# Patient Record
Sex: Female | Born: 1975 | Race: White | Hispanic: Yes | Marital: Married | Smoking: Never smoker
Health system: Southern US, Community
[De-identification: ages and names within clinical notes are randomized; demographics above are authoritative.]

## PROBLEM LIST (undated history)

## (undated) ENCOUNTER — Inpatient Hospital Stay (HOSPITAL_COMMUNITY): Payer: Self-pay

## (undated) DIAGNOSIS — Z789 Other specified health status: Secondary | ICD-10-CM

## (undated) HISTORY — PX: DILATION AND CURETTAGE OF UTERUS: SHX78

## (undated) HISTORY — PX: CHOLECYSTECTOMY: SHX55

---

## 2000-02-03 ENCOUNTER — Ambulatory Visit (HOSPITAL_COMMUNITY): Admission: RE | Admit: 2000-02-03 | Discharge: 2000-02-03 | Payer: Self-pay | Admitting: *Deleted

## 2000-06-12 ENCOUNTER — Inpatient Hospital Stay (HOSPITAL_COMMUNITY): Admission: AD | Admit: 2000-06-12 | Discharge: 2000-06-15 | Payer: Self-pay | Admitting: Obstetrics & Gynecology

## 2001-08-11 ENCOUNTER — Emergency Department (HOSPITAL_COMMUNITY): Admission: EM | Admit: 2001-08-11 | Discharge: 2001-08-11 | Payer: Self-pay | Admitting: Emergency Medicine

## 2004-05-05 ENCOUNTER — Emergency Department (HOSPITAL_COMMUNITY): Admission: EM | Admit: 2004-05-05 | Discharge: 2004-05-05 | Payer: Self-pay | Admitting: Emergency Medicine

## 2004-07-14 ENCOUNTER — Emergency Department (HOSPITAL_COMMUNITY): Admission: EM | Admit: 2004-07-14 | Discharge: 2004-07-14 | Payer: Self-pay | Admitting: Emergency Medicine

## 2004-10-06 ENCOUNTER — Ambulatory Visit (HOSPITAL_COMMUNITY): Admission: RE | Admit: 2004-10-06 | Discharge: 2004-10-06 | Payer: Self-pay | Admitting: *Deleted

## 2005-03-01 ENCOUNTER — Inpatient Hospital Stay (HOSPITAL_COMMUNITY): Admission: AD | Admit: 2005-03-01 | Discharge: 2005-03-05 | Payer: Self-pay | Admitting: Obstetrics & Gynecology

## 2005-03-01 ENCOUNTER — Ambulatory Visit: Payer: Self-pay | Admitting: *Deleted

## 2005-03-10 ENCOUNTER — Inpatient Hospital Stay (HOSPITAL_COMMUNITY): Admission: AD | Admit: 2005-03-10 | Discharge: 2005-03-10 | Payer: Self-pay | Admitting: Obstetrics and Gynecology

## 2005-03-16 ENCOUNTER — Inpatient Hospital Stay (HOSPITAL_COMMUNITY): Admission: AD | Admit: 2005-03-16 | Discharge: 2005-03-16 | Payer: Self-pay | Admitting: *Deleted

## 2005-03-16 ENCOUNTER — Ambulatory Visit: Payer: Self-pay | Admitting: *Deleted

## 2008-09-13 ENCOUNTER — Inpatient Hospital Stay (HOSPITAL_COMMUNITY): Admission: AD | Admit: 2008-09-13 | Discharge: 2008-09-13 | Payer: Self-pay | Admitting: Obstetrics & Gynecology

## 2008-11-12 ENCOUNTER — Inpatient Hospital Stay (HOSPITAL_COMMUNITY): Admission: AD | Admit: 2008-11-12 | Discharge: 2008-11-12 | Payer: Self-pay | Admitting: Family Medicine

## 2008-11-30 ENCOUNTER — Inpatient Hospital Stay (HOSPITAL_COMMUNITY): Admission: AD | Admit: 2008-11-30 | Discharge: 2008-12-02 | Payer: Self-pay | Admitting: Obstetrics & Gynecology

## 2008-11-30 ENCOUNTER — Ambulatory Visit: Payer: Self-pay | Admitting: Obstetrics & Gynecology

## 2008-12-01 ENCOUNTER — Encounter: Payer: Self-pay | Admitting: Obstetrics & Gynecology

## 2008-12-18 ENCOUNTER — Ambulatory Visit: Payer: Self-pay | Admitting: Family Medicine

## 2009-02-26 ENCOUNTER — Emergency Department (HOSPITAL_COMMUNITY): Admission: EM | Admit: 2009-02-26 | Discharge: 2009-02-27 | Payer: Self-pay | Admitting: Emergency Medicine

## 2009-09-08 ENCOUNTER — Encounter: Admission: RE | Admit: 2009-09-08 | Discharge: 2009-09-08 | Payer: Self-pay | Admitting: Geriatric Medicine

## 2009-09-19 ENCOUNTER — Emergency Department (HOSPITAL_COMMUNITY): Admission: EM | Admit: 2009-09-19 | Discharge: 2009-09-20 | Payer: Self-pay | Admitting: Emergency Medicine

## 2009-09-21 ENCOUNTER — Inpatient Hospital Stay (HOSPITAL_COMMUNITY): Admission: EM | Admit: 2009-09-21 | Discharge: 2009-09-24 | Payer: Self-pay | Admitting: Emergency Medicine

## 2009-09-22 ENCOUNTER — Encounter (INDEPENDENT_AMBULATORY_CARE_PROVIDER_SITE_OTHER): Payer: Self-pay | Admitting: General Surgery

## 2009-09-23 ENCOUNTER — Ambulatory Visit: Payer: Self-pay | Admitting: Gastroenterology

## 2009-10-22 ENCOUNTER — Encounter: Payer: Self-pay | Admitting: Gastroenterology

## 2010-09-15 NOTE — Procedures (Signed)
Summary: ERCP  Patient: [name] Note: All result statuses are Final unless otherwise noted.  Tests: (1) ERCP (ERC)   ERC ERCP                  DONE     [facility]     [address] [place], [place]  [address]           ERCP PROCEDURE REPORT           PATIENT:  [name], [name]  MR#:  [id number]     BIRTHDATE:  10-12-1975  GENDER:  female     ENDOSCOPIST:  Rachael Fee, MD     PROCEDURE DATE:  09/23/2009     PROCEDURE:  ERCP with sphincterotomy, ERCP with balloon passage     INDICATIONS:  recent lap chole, abnormal IOC suggesting distal CBD     stone           Referred by:   Darnell Level, MD     MEDICATIONS:  Fentanyl 100 mcg IV, Versed 10 mg IV, IV cipro as     inpatient     TOPICAL ANESTHETIC:  Cetacaine Spray           DESCRIPTION OF PROCEDURE:   After the risks benefits and     alternatives of the procedure were thoroughly explained, informed     consent was obtained.  Scout film showed surgical clips and JP     drain in RUQ.  The  endoscope was introduced through the mouth and     advanced to the second portion of the duodenum without detailed     examination of the UGI tract. The major papilla appeared normal.     A 44 Autotome over a .035 hydrawire was used to cannulate the bile     duct and then dye was injected. Cholangiogram revealed slightly     dilated CBD (7mm), non-dilated intrahepatic ducts, no biliary     leak.  The distal CBD ended somewhat bluntly, but there were no     clear mobile filling defects with bile duct. An adequate biliary     sphincterotomy was performed over the wire and then CBD was swept     3 times with a biliary balloon.  No debris, sludge or stones was     delivered into the duodenum.  Completion, occlusion cholangiogram     was normal.  The pancreatic duct was never cannulated or injected     with dye.           <<PROCEDUREIMAGES>>           COMPLICATIONS:  None           ENDOSCOPIC IMPRESSION:     1) Distal  CBD was blunted; I suspect that this was from     periampullary edema or perhaps ampullary stenosis.  Sphincterotomy     performed and bile duct swept without delivery of stone, debris or     sludge           RECOMMENDATIONS:     1) Follow clinically, OK to discharge per surgical service.     Watch for clinical pancreatitis.           ______________________________     Rachael Fee, MD           n.     eSIGNED:   Rachael Fee at 09/23/2009 09:56 AM  Nessie, Nong, [phone]  Note: An exclamation mark (!) indicates a result that was not dispersed into the flowsheet. Document Creation Date: 09/23/2009 9:57 AM _______________________________________________________________________  (1) Order result status: Final Collection or observation date-time: 09/23/2009 09:44 Requested date-time:  Receipt date-time:  Reported date-time:  Referring Physician:   Ordering Physician: Rob Bunting [phone]) Specimen Source:  Source: Launa Grill Order Number: [id number] Lab site:

## 2010-09-15 NOTE — Letter (Signed)
Summary: [facility]   Imported By: Sherian Rein 11/11/2009 11:08:56  _____________________________________________________________________  External Attachment:    Type:   Image     Comment:   External Document

## 2010-11-04 LAB — CBC
HCT: 35.1 % — ABNORMAL LOW (ref 36.0–46.0)
HCT: 35.6 % — ABNORMAL LOW (ref 36.0–46.0)
HCT: 42 % (ref 36.0–46.0)
Hemoglobin: 11.8 g/dL — ABNORMAL LOW (ref 12.0–15.0)
Hemoglobin: 11.9 g/dL — ABNORMAL LOW (ref 12.0–15.0)
Hemoglobin: 14.2 g/dL (ref 12.0–15.0)
MCHC: 33.1 g/dL (ref 30.0–36.0)
MCHC: 33.4 g/dL (ref 30.0–36.0)
MCHC: 33.8 g/dL (ref 30.0–36.0)
MCV: 84.6 fL (ref 78.0–100.0)
MCV: 83.1 fL (ref 78.0–100.0)
MCV: 83.3 fL (ref 78.0–100.0)
Platelets: 270 10*3/uL (ref 150–400)
Platelets: 215 10*3/uL (ref 150–400)
Platelets: 266 10*3/uL (ref 150–400)
RBC: 4.22 MIL/uL (ref 3.87–5.11)
RBC: 5.04 MIL/uL (ref 3.87–5.11)
RDW: 14 % (ref 11.5–15.5)
RDW: 14.3 % (ref 11.5–15.5)
RDW: 14.3 % (ref 11.5–15.5)
WBC: 15.8 10*3/uL — ABNORMAL HIGH (ref 4.0–10.5)
WBC: 10.6 10*3/uL — ABNORMAL HIGH (ref 4.0–10.5)

## 2010-11-04 LAB — URINALYSIS, ROUTINE W REFLEX MICROSCOPIC
Glucose, UA: NEGATIVE mg/dL
Ketones, ur: NEGATIVE mg/dL
Protein, ur: NEGATIVE mg/dL
Specific Gravity, Urine: 1.019 (ref 1.005–1.030)
pH: 6.5 (ref 5.0–8.0)

## 2010-11-04 LAB — URINE MICROSCOPIC-ADD ON

## 2010-11-04 LAB — URINE CULTURE: Colony Count: 25000

## 2010-11-04 LAB — COMPREHENSIVE METABOLIC PANEL
ALT: 257 U/L — ABNORMAL HIGH (ref 0–35)
AST: 180 U/L — ABNORMAL HIGH (ref 0–37)
AST: 224 U/L — ABNORMAL HIGH (ref 0–37)
AST: 92 U/L — ABNORMAL HIGH (ref 0–37)
Albumin: 4 g/dL (ref 3.5–5.2)
Albumin: 2.9 g/dL — ABNORMAL LOW (ref 3.5–5.2)
Alkaline Phosphatase: 100 U/L (ref 39–117)
Alkaline Phosphatase: 120 U/L — ABNORMAL HIGH (ref 39–117)
Alkaline Phosphatase: 136 U/L — ABNORMAL HIGH (ref 39–117)
Alkaline Phosphatase: 138 U/L — ABNORMAL HIGH (ref 39–117)
Alkaline Phosphatase: 165 U/L — ABNORMAL HIGH (ref 39–117)
BUN: 4 mg/dL — ABNORMAL LOW (ref 6–23)
CO2: 27 mEq/L (ref 19–32)
CO2: 24 mEq/L (ref 19–32)
CO2: 25 mEq/L (ref 19–32)
Calcium: 8.3 mg/dL — ABNORMAL LOW (ref 8.4–10.5)
Calcium: 8.5 mg/dL (ref 8.4–10.5)
Chloride: 105 mEq/L (ref 96–112)
Creatinine, Ser: 0.49 mg/dL (ref 0.4–1.2)
Creatinine, Ser: 0.5 mg/dL (ref 0.4–1.2)
GFR calc Af Amer: >60 mL/min (ref >60)
GFR calc Af Amer: >60 mL/min (ref >60)
GFR calc Af Amer: >60 mL/min (ref >60)
GFR calc non Af Amer: >60 mL/min (ref >60)
GFR calc non Af Amer: >60 mL/min (ref >60)
Glucose, Bld: 107 mg/dL — ABNORMAL HIGH (ref 70–99)
Glucose, Bld: 114 mg/dL — ABNORMAL HIGH (ref 70–99)
Glucose, Bld: 148 mg/dL — ABNORMAL HIGH (ref 70–99)
Potassium: 3.4 mEq/L — ABNORMAL LOW (ref 3.5–5.1)
Potassium: 3.5 mEq/L (ref 3.5–5.1)
Potassium: 4.3 mEq/L (ref 3.5–5.1)
Sodium: 135 mEq/L (ref 135–145)
Sodium: 138 mEq/L (ref 135–145)
Total Bilirubin: 3.8 mg/dL — ABNORMAL HIGH (ref 0.3–1.2)
Total Protein: 6.2 g/dL (ref 6.0–8.3)
Total Protein: 6.2 g/dL (ref 6.0–8.3)
Total Protein: 7.7 g/dL (ref 6.0–8.3)

## 2010-11-04 LAB — DIFFERENTIAL
Basophils Absolute: 0 10*3/uL (ref 0.0–0.1)
Eosinophils Relative: 0 % (ref 0–5)
Monocytes Absolute: 1.2 10*3/uL — ABNORMAL HIGH (ref 0.1–1.0)
Monocytes Relative: 8 % (ref 3–12)
Monocytes Relative: 7 % (ref 3–12)
Neutro Abs: 12.7 10*3/uL — ABNORMAL HIGH (ref 1.7–7.7)
Neutrophils Relative %: 80 % — ABNORMAL HIGH (ref 43–77)
Neutrophils Relative %: 67 % (ref 43–77)

## 2010-11-04 LAB — PROTIME-INR
INR: 0.91 (ref 0.00–1.49)
Prothrombin Time: 12.2 seconds (ref 11.6–15.2)

## 2010-11-04 LAB — PREGNANCY, URINE: Preg Test, Ur: NEGATIVE

## 2010-11-23 LAB — URINE MICROSCOPIC-ADD ON

## 2010-11-23 LAB — DIFFERENTIAL
Basophils Absolute: 0 10*3/uL (ref 0.0–0.1)
Eosinophils Relative: 0 % (ref 0–5)
Lymphocytes Relative: 17 % (ref 12–46)
Lymphs Abs: 2.2 10*3/uL (ref 0.7–4.0)
Monocytes Absolute: 1.1 10*3/uL — ABNORMAL HIGH (ref 0.1–1.0)
Neutro Abs: 10 10*3/uL — ABNORMAL HIGH (ref 1.7–7.7)

## 2010-11-23 LAB — COMPREHENSIVE METABOLIC PANEL
AST: 42 U/L — ABNORMAL HIGH (ref 0–37)
Albumin: 3.3 g/dL — ABNORMAL LOW (ref 3.5–5.2)
BUN: 11 mg/dL (ref 6–23)
Calcium: 9.2 mg/dL (ref 8.4–10.5)
Creatinine, Ser: 0.64 mg/dL (ref 0.4–1.2)
GFR calc Af Amer: >60 mL/min (ref >60)
GFR calc non Af Amer: >60 mL/min (ref >60)
Total Bilirubin: 0.4 mg/dL (ref 0.3–1.2)

## 2010-11-23 LAB — LIPASE, BLOOD: Lipase: 17 U/L (ref 11–59)

## 2010-11-23 LAB — CBC
HCT: 36.5 % (ref 36.0–46.0)
MCHC: 32.9 g/dL (ref 30.0–36.0)
MCV: 79.8 fL (ref 78.0–100.0)
Platelets: 252 10*3/uL (ref 150–400)
RDW: 15.8 % — ABNORMAL HIGH (ref 11.5–15.5)

## 2010-11-23 LAB — WET PREP, GENITAL: Clue Cells Wet Prep HPF POC: NONE SEEN

## 2010-11-23 LAB — URINALYSIS, ROUTINE W REFLEX MICROSCOPIC
Leukocytes, UA: NEGATIVE
Protein, ur: NEGATIVE mg/dL
Specific Gravity, Urine: 1.039 — ABNORMAL HIGH (ref 1.005–1.030)
Urobilinogen, UA: 1 mg/dL (ref 0.0–1.0)

## 2010-11-23 LAB — GC/CHLAMYDIA PROBE AMP, GENITAL
Chlamydia, DNA Probe: NEGATIVE
GC Probe Amp, Genital: NEGATIVE

## 2010-11-25 LAB — URINE CULTURE
Colony Count: 5000
Colony Count: >=100000
Special Requests: NEGATIVE
Special Requests: NEGATIVE

## 2010-11-25 LAB — CBC
Hemoglobin: 10.5 g/dL — ABNORMAL LOW (ref 12.0–15.0)
Hemoglobin: 12.2 g/dL (ref 12.0–15.0)
Hemoglobin: 8.9 g/dL — ABNORMAL LOW (ref 12.0–15.0)
MCHC: 34.1 g/dL (ref 30.0–36.0)
MCHC: 34.9 g/dL (ref 30.0–36.0)
MCV: 90 fL (ref 78.0–100.0)
Platelets: 167 10*3/uL (ref 150–400)
RBC: 2.9 MIL/uL — ABNORMAL LOW (ref 3.87–5.11)
RDW: 13.8 % (ref 11.5–15.5)
RDW: 14.1 % (ref 11.5–15.5)
RDW: 14.2 % (ref 11.5–15.5)
WBC: 15 10*3/uL — ABNORMAL HIGH (ref 4.0–10.5)

## 2010-11-25 LAB — GC/CHLAMYDIA PROBE AMP, GENITAL
Chlamydia, DNA Probe: NEGATIVE
GC Probe Amp, Genital: NEGATIVE

## 2010-11-25 LAB — HEPATITIS B SURFACE ANTIGEN: Hepatitis B Surface Ag: NEGATIVE

## 2010-11-25 LAB — STREP B DNA PROBE: Strep Group B Ag: POSITIVE

## 2010-11-25 LAB — DIFFERENTIAL
Basophils Absolute: 0.1 10*3/uL (ref 0.0–0.1)
Lymphocytes Relative: 13 % (ref 12–46)
Lymphs Abs: 1.9 10*3/uL (ref 0.7–4.0)
Monocytes Absolute: 1.1 10*3/uL — ABNORMAL HIGH (ref 0.1–1.0)
Neutro Abs: 11.9 10*3/uL — ABNORMAL HIGH (ref 1.7–7.7)

## 2010-11-25 LAB — URINALYSIS, ROUTINE W REFLEX MICROSCOPIC
Ketones, ur: NEGATIVE mg/dL
Leukocytes, UA: NEGATIVE
Nitrite: NEGATIVE
Protein, ur: NEGATIVE mg/dL
Urobilinogen, UA: 0.2 mg/dL (ref 0.0–1.0)

## 2010-11-25 LAB — RPR: RPR Ser Ql: NONREACTIVE

## 2010-11-25 LAB — WET PREP, GENITAL: Yeast Wet Prep HPF POC: NONE SEEN

## 2010-11-25 LAB — URINE MICROSCOPIC-ADD ON

## 2010-11-25 LAB — RUBELLA SCREEN: Rubella: 183.5 IU/mL — ABNORMAL HIGH

## 2010-11-25 LAB — PROTIME-INR
INR: 1.1 (ref 0.00–1.49)
Prothrombin Time: 14.7 seconds (ref 11.6–15.2)

## 2010-11-26 LAB — CBC
MCHC: 34 g/dL (ref 30.0–36.0)
MCV: 88.1 fL (ref 78.0–100.0)
Platelets: 192 10*3/uL (ref 150–400)
RBC: 3.84 MIL/uL — ABNORMAL LOW (ref 3.87–5.11)
WBC: 9 10*3/uL (ref 4.0–10.5)

## 2010-11-26 LAB — URINALYSIS, ROUTINE W REFLEX MICROSCOPIC
Bilirubin Urine: NEGATIVE
Glucose, UA: NEGATIVE mg/dL
Specific Gravity, Urine: 1.01 (ref 1.005–1.030)

## 2010-11-26 LAB — URINE MICROSCOPIC-ADD ON

## 2010-11-26 LAB — GC/CHLAMYDIA PROBE AMP, GENITAL
Chlamydia, DNA Probe: NEGATIVE
GC Probe Amp, Genital: NEGATIVE

## 2010-11-30 LAB — WET PREP, GENITAL
WBC, Wet Prep HPF POC: NONE SEEN
Yeast Wet Prep HPF POC: NONE SEEN

## 2010-11-30 LAB — URINALYSIS, ROUTINE W REFLEX MICROSCOPIC
Leukocytes, UA: NEGATIVE
Nitrite: NEGATIVE
Specific Gravity, Urine: 1.02 (ref 1.005–1.030)
Urobilinogen, UA: 0.2 mg/dL (ref 0.0–1.0)
pH: 7 (ref 5.0–8.0)

## 2010-11-30 LAB — ABO/RH: ABO/RH(D): A POS

## 2010-11-30 LAB — CBC
HCT: 37.6 % (ref 36.0–46.0)
MCHC: 33.8 g/dL (ref 30.0–36.0)
MCV: 88.5 fL (ref 78.0–100.0)
Platelets: 234 10*3/uL (ref 150–400)
RDW: 12.8 % (ref 11.5–15.5)
WBC: 10.4 10*3/uL (ref 4.0–10.5)

## 2010-11-30 LAB — URINE MICROSCOPIC-ADD ON

## 2010-12-29 NOTE — Discharge Summary (Signed)
NAMESAMEERAH, NACHTIGAL    ACCOUNT NO.:  [id number]   MEDICAL RECORD NO.:  [id number]          PATIENT TYPE:  INP   LOCATION:  9320                          [facility]ILITY:  [facility]   PHYSICIAN:  Norton Blizzard, MD    DATE OF BIRTH:  [birth year]   DATE OF ADMISSION:  11/30/2008  DATE OF DISCHARGE:  12/02/2008                               DISCHARGE SUMMARY   DISCHARGE DIAGNOSES:  1. Incompetent cervix.  2. Premature preterm rupture of membranes.  3. Premature delivery.  4. Endomyometritis.  5. Retained placenta.   REASON FOR ADMISSION:  [name] is a 35 year old, gravida 3,  para 1-1-1-2, admitted at 54 and 1 weeks' gestational age with abdominal  pain and leakage of fluid.  On evaluation in the MAU, she was found to  be grossly ruptured with bulging membranes protruding through the  cervix.  She was admitted initially for expectant management and then  for delivery.   HOSPITAL COURSE:  The patient was admitted and shortly after admission  developed a fever.  Because of the infection, the patient was counseled  and she proceeded with an induction of labor.  After some Pitocin, the  patient had a vaginal delivery of a nonviable female on December 01, 2008,  at 4:30 in the morning.  She had been on Zosyn.  She received Cytotec  per vagina, and Pitocin was started; however, after 2 hours  she was  noted to have increased bleeding with clots and the patient unable to  tolerate vaginal exams.  Therefore, the patient was consented for  operative removal of the placenta.  The patient went to the operating  room and under anesthesia an exam was performed and the placenta was  manually removed by Dr. Elsie Lincoln.  Please refer to her dictation  for further details of this procedure.  The patient did very well after  the procedure.  She remained afebrile for over twenty four hours after  delivery.  Her abdominal pain is minimal.  Her lochia is also minimal.  She is ambulating and  has no significant complaints.  At time of  discharge, her white blood cell count was 10.3, hemoglobin/hematocrit  8.9/26.1 respectively, and platelets 153.   PROCEDURES:  1. Spontaneous vaginal delivery.  2. Examination under anesthesia with manual removal of placenta.   FINDINGS:  As per hospital course.   CONDITION AT DISPOSITION:  Good.   MEDICATIONS AT DISCHARGE:  Ibuprofen and Percocet.   INSTRUCTIONS FOR THE PATIENT:  The patient was instructed to follow up  in the clinic in 2-3 weeks, and she will be given an appointment for  this.  She was instructed that it is recommended that she avoid getting  pregnant for at least 3 months to allow for recuperation.  Additionally,  she was also instructed that should she become pregnant  she needs to come the clinic at the earliest when she knows she is  pregnant because most likely she will need a cerclage.  The patient  voiced understanding of these instructions.  We will discuss  contraception at her followup appointment in 2-3 weeks.      Odie Sera, DO  Electronically Signed     ______________________________  Norton Blizzard, MD    MC/MEDQ  D:  12/02/2008  T:  12/02/2008  Job:  [id number]

## 2010-12-29 NOTE — Group Therapy Note (Signed)
NAMEBRESHAY, [name] NO.:  [id number]   MEDICAL RECORD NO.:  [id number]          PATIENT TYPE:  WOC   LOCATION:  [facility]                   FACILITY:  [facility]   PHYSICIAN:  Argentina Donovan, MD        DATE OF BIRTH:  Dec 06, 1975   DATE OF SERVICE:                                  CLINIC NOTE   REASON FOR VISIT:  Follow up from fetal loss.   HISTORY OF PRESENT ILLNESS:  [name] is a 35 year old  gravida 3, para 1-1-1-2, who on November 30, 2008 was admitted to hospital  after she presented at approximately 26 and 1 weeks' gestation with  grossly ruptured membranes.  She went on to be induced and delivered a  nonviable infant on December 01, 2008.  Today, she relates that she has  minimal to no lower abdominal pain, and she has absolutely no bleeding.  She does, however, relate that she has intermittent midepigastric  burning that occurs from time to time.  She is also requesting  contraception pills.   PAST MEDICAL HISTORY:  None.   PAST SURGICAL HISTORY:  She has a history of cesarean section due to  reason unknown at this time.   MEDICATIONS:  None.   ALLERGIES:  She has no known allergies.   SOCIAL HISTORY:  She denies tobacco or alcohol use.   PHYSICAL EXAMINATION:  GENERAL:  She is healthy-appearing, in no acute  distress.  VITAL SIGNS:  Blood pressure 97/65, heart rate 67, respiratory rate 20,  temperature 97.0.  She is 5 feet 2 inches and weighs 185 pounds.  ABDOMEN:  Soft.  No tenderness is noted upon palpation.  She has no  organomegaly, and the uterus is not palpable.   ASSESSMENT/PLAN:  1. Status post delivery of a non viable infant secondary to cervical      incontinence and preterm premature rupture of membranes.  2. Contraceptive counseling.  The patient was started on Sprintec;      given a prescription for that.  3. Gastroesophageal reflux disease, and she was recommended to use      over-the-counter Zantac which she can get a [employer]  for four      dollars.   FOLLOWUP:  The patient will follow up on an as-needed basis.     ______________________________  Odie Sera, D.O.    ______________________________  Argentina Donovan, MD    MC/MEDQ  D:  12/18/2008  T:  12/18/2008  Job:  [id number]

## 2010-12-29 NOTE — Op Note (Signed)
[name], WHITEHAIR    ACCOUNT NO.:  [id number]   MEDICAL RECORD NO.:  [id number]          PATIENT TYPE:  INP   LOCATION:  9320                          [facility]ILITY[facility]   PHYSICIAN:  Lesly Dukes, M.D. DATE OF BIRTH:  1976-01-15   DATE OF PROCEDURE:  12/01/2008  DATE OF DISCHARGE:                               OPERATIVE REPORT   PREOPERATIVE DIAGNOSES:  A 36 year old female with retained placenta and  heavy vaginal bleeding status post miscarriage of a 19-week fetus.   POSTOPERATIVE DIAGNOSES:  A 35 year old female with retained placenta  and heavy vaginal bleeding status post miscarriage of a 19-week fetus.   PROCEDURE:  Exam under anesthesia and manual removal of placenta.   SURGEON:  Lesly Dukes, MD   ANESTHESIA:  General.   SPECIMENS:  Placenta to pathology.   ESTIMATED BLOOD LOSS:  500.   COMPLICATIONS:  None.   PROCEDURE:  After informed consent was obtained, the patient was taken  to the operating room where general anesthesia was induced.  The patient  was placed in dorsal lithotomy position and bladder was emptied.  Exam  under anesthesia was performed and placenta was removed manually.  The  urine cavity was explored to ensure that all products  of consumption  were removed.  The uterus was bleeding heavily at this point and the  patient received potassium bolus.  The bleeding was still continuing, so  we gave her 1 dose of Methergine which helped the uterus contract.  At  the end of the procedure, there was no bleeding visible.  The uterus was  nicely contracted.  The patient tolerated the procedure well.  Sponge,  lap, instrument, and needle counts were correct x2.  The patient to  recovery room in stable condition.      Lesly Dukes, M.D.  Electronically Signed     KHL/MEDQ  D:  12/01/2008  T:  12/02/2008  Job:  [id number]

## 2011-01-01 NOTE — Discharge Summary (Signed)
[name], [name]    ACCOUNT NO.:  [id number]   MEDICAL RECORD NO.:  [id number]          PATIENT TYPE:  INP   LOCATION:  9147                          [facility]   PHYSICIAN:  Barth Kirks, M.D.  DATE OF BIRTH:  06/29/1976   DATE OF ADMISSION:  03/01/2005  DATE OF DISCHARGE:  03/05/2005                                 DISCHARGE SUMMARY   ADMISSION DIAGNOSES:  1.  A 39-1/7 week intrauterine pregnancy.  2.  Spontaneous rupture of membranes.   DISCHARGE DIAGNOSES:  1.  Term low transverse cesarean section delivery of a female infant.  2.  Emergent low transverse cesarean section for arrest of descent, occiput      posterior position with fetal macrosomia.   PROCEDURE:  A low transverse cesarean section, emergent.   PERTINENT LABORATORY DATA:  On the day of discharge hemoglobin is 8.8,  hematocrit 27.1 post-delivery. CBC prior to delivery was: hemoglobin 11.4  and hematocrit of 35.6. The patient is rubella immune.  Blood type A positive, antibody negative. GBS negative.   HISTORY OF PRESENT ILLNESS:  The patient is a 35 year old G2, P0, 1, 0, 1,  who presented to MAU at 39-1/7 weeks with complaint of spontaneous rupture  of membranes at 10 a.m. on the day of admission. She had lower abdominal  pain. The patient stated she had contractions and rupture of membranes, no  vaginal bleeding. I did feel the fetus moving. It was noted that her vaginal  loss of fluid was clear. On cervical exam the patient was noted to be 4 to 5  and 90, -1, with a well applied vertex head. Baseline fetal heart rate was  140's to 150's with positive reactivity and positive variability. Her  contractions were every 7 minutes with duration of 30 seconds.  Bedside  ultrasound confirmed a vertex position. Vital signs were stable with a blood  pressure of 109/70 and a temperature of 98.6. It was decided to admit the  patient to L&D to begin induction of labor for spontaneous rupture of  membranes with Pitocin.   BRIEF HOSPITAL COURSE:  The patient was admitted to L&D and received an  epidural. She was then started on low dose Pitocin at 3 milliunits per  minute, having contractions every 2-4 minutes. The patient became fully  dilated and complete. She was then noted to not be pushing well after the  epidural was in place. However it was working very well. The patient was  given time to labor down. However, her pushing was not effective after the  hour of laboring down. The patient was noted to have an arrest of descent  and it was necessary for an urgent low transverse cesarean section. A low  transverse cesarean section was performed by Dr. Corky Sox on March 02, 2005. Estimated blood loss was 800 mL. Under continuous lumbar epidural  anesthesia, a viable female infant was delivered from the persistent occiput  anterior position without difficulty. The cord was doubly clamped and cut  and the baby was handed to the neonatologist in attendance. The placenta was  delivered spontaneously. There was some uterine atony and the patient  received  Methergine to which the uterus responded. All incisions were closed  in the usual fashion. Apgar scores were 9 and 9, weight was 9 pounds and 2  ounces. The patient was then transferred to the PACU and then to the  mother/baby floor on March 02, 2005. The patient has been doing well,  ambulating, has had a bowel movement and is having urination. She has  decreased bleeding. Her uterus is firm and below the umbilicus. Her incision  is well approximated without exudate. The patient is moderately obese and  will need to have her staples removed in the MAU 5 days after discharge. The  patient is without complaint and is now ready for discharge. She has  received maximum benefit of this hospitalization.   PHYSICAL EXAMINATION:  On the day of discharge vital signs: temperature  97.3, pulse 64, respirations 20, blood pressure 100/60. She  has a post-  delivery hemoglobin of 8.8. Her general exam reveals she is in no apparent  distress. Well-developed, Hispanic female. Cardiovascular - regular rate and  rhythm. Lungs clear to auscultation bilaterally. Extremities reveal no lower  extremity edema. The abdomen is soft, nontender with appropriately  tenderness around the incision. The uterus is firm, below the umbilicus. No  exudate noted, clear, dry and intact incision.   The patient is to follow up with [facility] in 6 weeks. The patient also  is to follow up at the MAU for staple removal 5 days post-discharge. The  patient will be bottle feeding and breast feeding. The patient desires IUD  for contraception. Will have this at the 6-week check up at [facility].  Note - the patient's baby is a female and she did not desire a circumcision at  this time.       MB/MEDQ  D:  03/05/2005  T:  03/05/2005  Job:  [id number]   cc:   [facility]

## 2011-01-01 NOTE — Op Note (Signed)
Michele, [name]    ACCOUNT NO.:  [id number]   MEDICAL RECORD NO.:  [id number]          PATIENT TYPE:  INP   LOCATION:  9147                          [facility]   PHYSICIAN:  Conni Elliot, M.D.DATE OF BIRTH:  Jul 13, 1976   DATE OF PROCEDURE:  03/02/2005  DATE OF DISCHARGE:                                 OPERATIVE REPORT   PREOPERATIVE DIAGNOSES:  1.  Arrest of descent.  2.  Persistent occiput posterior position.   POSTOPERATIVE DIAGNOSES:  1.  Arrest of descent.  2.  Persistent occiput posterior position.  3.  Fetal macrosomia.   OPERATION:  Low transverse cesarean section.   OPERATOR:  Conni Elliot, M.D.   ANESTHESIA:  Continuous lumbar epidural.   OPERATIVE PROCEDURE:  After placing the patient under continuous lumbar  epidural anesthetic, brought to an operative level, patient was supine in  left lateral tilt position and was prepped and draped in a sterile fashion.  A low transverse Pfannenstiel incision was made and incision made through  the fascia, the rectus muscles separated in the midline and the peritoneum  entered.  A bladder flap created.  A low transverse uterine incision was  made.  The baby was delivered from the persistent OP position without  difficulty, cord doubly clamped and cut and baby handed to neonatologist in  attendance.  The placenta was delivered spontaneously.  There was some  uterine atony, so the patient received Methergine.  Thus, the uterus  responded to that.  The uterus, bladder flap, peritoneum, fascia,  subcutaneous tissue and skin were closed in the usual fashion.  Estimated  blood loss 800 mL without replacement.  The fetus was a female weighing 9  pounds 2 ounces, Apgars of 9 and 9.       ASG/MEDQ  D:  03/02/2005  T:  03/02/2005  Job:  [id number]

## 2011-02-08 NOTE — Procedures (Signed)
Summary: ERCP   ERCP  Procedure date:  09/23/2009  Findings:      Location: [facility].  ERCP PROCEDURE REPORT  PATIENT:  [name], [name]  MR#:  [id number] BIRTHDATE:   03-07-1976   GENDER:   female ENDOSCOPIST:   Rachael Fee, MD PROCEDURE DATE:  09/23/2009 PROCEDURE:  ERCP with sphincterotomy, ERCP with balloon passage INDICATIONS: recent lap chole, abnormal IOC suggesting distal CBD stone  Referred by:    Darnell Level, MD MEDICATIONS:   Fentanyl 100 mcg IV, Versed 10 mg IV, IV cipro as inpatient TOPICAL ANESTHETIC:   Cetacaine Spray  DESCRIPTION OF PROCEDURE:   After the risks benefits and alternatives of the procedure were thoroughly explained, informed consent was obtained.  Scout film showed surgical clips and JP drain in RUQ.  The  endoscope was introduced through the mouth and advanced to the second portion of the duodenum without detailed examination of the UGI tract. The major papilla appeared normal.  A 44 Autotome over a .035 hydrawire was used to cannulate the bile duct and then dye was injected. Cholangiogram revealed slightly dilated CBD (7mm), non-dilated intrahepatic ducts, no biliary leak.  The distal CBD ended somewhat bluntly, but there were no clear mobile filling defects with bile duct. An adequate biliary sphincterotomy was performed over the wire and then CBD was swept 3 times with a biliary balloon.  No debris, sludge or stones was delivered into the duodenum.  Completion, occlusion cholangiogram was normal.  The pancreatic duct was never cannulated or injected with dye.   <<PROCEDUREIMAGES>>    <<OLD IMAGES>>  COMPLICATIONS:   None  ENDOSCOPIC IMPRESSION:  1) Distal CBD was blunted; I suspect that this was from periampullary edema or perhaps ampullary stenosis.  Sphincterotomy performed and bile duct swept without delivery of stone, debris or sludge  RECOMMENDATIONS:  1) Follow clinically, OK to discharge per surgical service.  Watch for  clinical pancreatitis.   _______________________________ Rachael Fee, MD

## 2011-08-17 NOTE — L&D Delivery Note (Signed)
NICU team notified when pt was 10 cm and they were present for delivery.  Pt received cytotec 800 mcg.  I was present for delivery and agree with note above. [facility]

## 2011-08-17 NOTE — L&D Delivery Note (Signed)
Delivery Note At 3:21 PM a viable 27 week preterm female was delivered via Vaginal, Spontaneous Delivery (Presentation: right occiput anterior).  APGAR: 8, 9; weight pending Placenta status: Intact, Spontaneous.  Cord: 3 vessels with the following complications: postpartum bleeding requiring Cytotec x1  Anesthesia: None  Episiotomy: None Lacerations: None Suture Repair: N/A Est. Blood Loss (mL): 400  Mom to postpartum.  Baby to nursery-stable.  Tana Conch 04/07/2012, 4:05 PM

## 2011-11-09 ENCOUNTER — Inpatient Hospital Stay (HOSPITAL_COMMUNITY): Payer: Self-pay

## 2011-11-09 ENCOUNTER — Encounter (HOSPITAL_COMMUNITY): Payer: Self-pay

## 2011-11-09 ENCOUNTER — Inpatient Hospital Stay (HOSPITAL_COMMUNITY)
Admission: AD | Admit: 2011-11-09 | Discharge: 2011-11-09 | Disposition: A | Discharge status: Home / Self Care | Payer: Self-pay | Source: Ambulatory Visit | Attending: Obstetrics & Gynecology | Admitting: Obstetrics & Gynecology

## 2011-11-09 DIAGNOSIS — O209 Hemorrhage in early pregnancy, unspecified: Secondary | ICD-10-CM | POA: Insufficient documentation

## 2011-11-09 HISTORY — DX: Other specified health status: Z78.9

## 2011-11-09 LAB — HCG, QUANTITATIVE, PREGNANCY: hCG, Beta Chain, Quant, S: 17229 m[IU]/mL — ABNORMAL HIGH (ref <5)

## 2011-11-09 LAB — URINALYSIS, ROUTINE W REFLEX MICROSCOPIC
Bilirubin Urine: NEGATIVE
Glucose, UA: 100 mg/dL — AB
Ketones, ur: NEGATIVE mg/dL
pH: 6 (ref 5.0–8.0)

## 2011-11-09 LAB — CBC
Platelets: 225 10*3/uL (ref 150–400)
RDW: 13.9 % (ref 11.5–15.5)
WBC: 9.4 10*3/uL (ref 4.0–10.5)

## 2011-11-09 LAB — WET PREP, GENITAL
Trich, Wet Prep: NONE SEEN
Yeast Wet Prep HPF POC: NONE SEEN

## 2011-11-09 LAB — URINE MICROSCOPIC-ADD ON

## 2011-11-09 NOTE — MAU Provider Note (Signed)
History     CSN: [id number]  Arrival date and time: 11/09/11 1158   First Provider Initiated Contact with Patient 11/09/11 1414      Chief Complaint  Patient presents with  . Vaginal Bleeding   HPI [name] is 36 y.o. [id number] [redacted]w[redacted]d weeks presenting with vaginal bleeding that began yesterday.  LMP 2/25.  Is concerned because she has a hx of pregnancy loss in second trimester.  She has an appointment to [facility].  She reports cramping more on the left.  Last bowel movement was yesterday and normal for her.  Eda Royal, interpreter in room.      Past Medical History  Diagnosis Date  . No pertinent past medical history     Past Surgical History  Procedure Date  . Dilation and curettage of uterus   . Cholecystectomy     No family history on file.  History  Substance Use Topics  . Smoking status: Not on file  . Smokeless tobacco: Not on file  . Alcohol Use: Not on file    Allergies: No Known Allergies  No prescriptions prior to admission    Review of Systems  Constitutional: Negative.   Gastrointestinal: Positive for abdominal pain (cramping).  Genitourinary:       + for vaginal bleeding   Physical Exam   Blood pressure 117/60, pulse 87, temperature 99.1 F (37.3 C), temperature source Oral, resp. rate 16, height 5' 2.75" (1.594 m), weight 91.683 kg (202 lb 2 oz), last menstrual period 10/01/2011.  Physical Exam  Constitutional: She is oriented to person, place, and time. She appears well-developed and well-nourished. No distress.  HENT:  Head: Normocephalic.  Neck: Normal range of motion.  Cardiovascular: Normal rate.   Respiratory: Effort normal.  GI: Soft. She exhibits no distension and no mass. There is no tenderness. There is no rebound and no guarding.  Genitourinary: Uterus is enlarged (slightly enlarged). Uterus is not tender. Cervix exhibits discharge. Right adnexum displays no mass, no tenderness and no fullness.  Left adnexum displays no mass, no tenderness and no fullness. No bleeding around the vagina. Vaginal discharge (pale brown tinted discharge without active bleeding) found.  Neurological: She is alert and oriented to person, place, and time.  Skin: Skin is warm and dry.   Results for orders placed during the hospital encounter of 11/09/11 (from the past 24 hour(s))  URINALYSIS, ROUTINE W REFLEX MICROSCOPIC     Status: Abnormal   Collection Time   11/09/11 12:25 PM      Component Value Range   Color, Urine YELLOW  YELLOW    APPearance HAZY (*) CLEAR    Specific Gravity, Urine >1.030 (*) 1.005 - 1.030    pH 6.0  5.0 - 8.0    Glucose, UA 100 (*) NEGATIVE (mg/dL)   Hgb urine dipstick LARGE (*) NEGATIVE    Bilirubin Urine NEGATIVE  NEGATIVE    Ketones, ur NEGATIVE  NEGATIVE (mg/dL)   Protein, ur NEGATIVE  NEGATIVE (mg/dL)   Urobilinogen, UA 0.2  0.0 - 1.0 (mg/dL)   Nitrite NEGATIVE  NEGATIVE    Leukocytes, UA NEGATIVE  NEGATIVE   URINE MICROSCOPIC-ADD ON     Status: Abnormal   Collection Time   11/09/11 12:25 PM      Component Value Range   Squamous Epithelial / LPF FEW (*) RARE    WBC, UA 0-2  <3 (WBC/hpf)   RBC / HPF 7-10  <3 (RBC/hpf)   Bacteria,  UA FEW (*) RARE   POCT PREGNANCY, URINE     Status: Abnormal   Collection Time   11/09/11 12:32 PM      Component Value Range   Preg Test, Ur POSITIVE (*) NEGATIVE   CBC     Status: Abnormal   Collection Time   11/09/11  1:04 PM      Component Value Range   WBC 9.4  4.0 - 10.5 (K/uL)   RBC 4.22  3.87 - 5.11 (MIL/uL)   Hemoglobin 11.9 (*) 12.0 - 15.0 (g/dL)   HCT 60.4 (*) 54.0 - 46.0 (%)   MCV 84.4  78.0 - 100.0 (fL)   MCH 28.2  26.0 - 34.0 (pg)   MCHC 33.4  30.0 - 36.0 (g/dL)   RDW 98.1  19.1 - 47.8 (%)   Platelets 225  150 - 400 (K/uL)  ABO/RH     Status: Normal   Collection Time   11/09/11  1:04 PM      Component Value Range   ABO/RH(D) A POS    HCG, QUANTITATIVE, PREGNANCY     Status: Abnormal   Collection Time   11/09/11   1:05 PM      Component Value Range   hCG, Beta Chain, Quant, S 17229 (*) <5 (mIU/mL)  WET PREP, GENITAL     Status: Abnormal   Collection Time   11/09/11  2:09 PM      Component Value Range   Yeast Wet Prep HPF POC NONE SEEN  NONE SEEN    Trich, Wet Prep NONE SEEN  NONE SEEN    Clue Cells Wet Prep HPF POC NONE SEEN  NONE SEEN    WBC, Wet Prep HPF POC FEW (*) NONE SEEN    Ultrasound Report:  Living IUP [redacted]w[redacted]d with small SCH, FHR 126, YS and FP seen.  EDD 07/07/12  MAU Course  Procedures  Gc/CHl culture to lab  MDM   Assessment and Plan  A:  Bleeding in early pregnancy      [redacted]w[redacted]d gestation with small subchorionic hemorrhage  P:  Pelvic rest until bleeding stops Return for increased bleeding or pain      Keep scheduled appointment in [facility] to begin prenatal care        Kwasi Joung,EVE M 11/09/2011, 2:16 PM

## 2011-11-09 NOTE — MAU Note (Signed)
States via [place], Spanish interpreter that LMP is 10/01/2011, coffee colored discharge noted since yesterday am, left sided lower abd pain, rates 4-5/10.

## 2011-11-09 NOTE — Discharge Instructions (Signed)
Hemorragia vaginal durante el embarazo, primer trimestre (Vaginal Bleeding During Pregnancy, First Trimester) Durante el embarazo es relativamente frecuente que se presente una pequea hemorragia (manchas). Esta situacin generalmente mejora por s misma. Existen muchas causas para la hemorragia o prdidas durante el principio del Psychiatrist. Algunas hemorragias pueden estar relacionadas al [employer] y otras no. Si aparecen retortijones con la hemorragia es ms serio y preocupante. Informe al mdico si tiene cualquier tipo de hemorragia vaginal.  CAUSAS  En la mayora de los [place] es normal.   El embarazo finaliza (aborto espontneo).   El Psychiatrist podra terminar (amenaza de aborto espontneo).   Infeccin o inflamacin del tero.   Tumores (plipos) en el tero.   El embarazo sucede por fuera del tero y en las trompas de falopio (embarazo ectpico).   Muchos quistes pequeos en el tero en lugar del tejido del embarazo (embarazo molar).  SNTOMAS Hemorragia o goteo vaginal con o sin retortijones. DIAGNSTICO Para evaluar el embarazo, el mdico podr:  Education officer, environmental un examen plvico.   Tomar anlisis de [place].   Realizar una prueba de [place].  Es importante seguir las indicaciones del profesional que la asiste.  TRATAMIENTO  Evaluacin del embarazo con anlisis de [place] y [place].   Reposo en cama (slo levantarse para ir al bao).   Inmunizacin con Rho-gam si la madre es Rh negativo y el padre es Rh positivo.  INSTRUCCIONES PARA EL CUIDADO DOMICILIARIO  Si el mdico le indica reposo en cama, usted necesitar hacer algunos arreglos para que otra persona se ocupe del cuidado de los nios y de otras responsabilidades adicionales. Sin embargo, podr autorizarla a Building surveyor.   Lleve un registro de la cantidad y la saturacin de las toallas higinicas que Landscape architect. Escriba esta informacin:   No use tampones. No utilice duchas vaginales.   No  tenga relaciones sexuales u orgasmos hasta que el mdico la autorice.   Guarde una muestra de los tejidos para que el profesional lo inspeccione.   Tome medicamentos para el dolor slo con el permiso del mdico.   No tome aspirina, ya que puede causar hemorragias.  SOLICITE ATENCIN MDICA INMEDIATAMENTE SI:  Siente calambres intensos en el estmago, en la espalda o en el vientre (abdomen).   Usted tiene una temperatura oral de ms de 102 F (38.9 C) y no puede controlarla con medicamentos.   Elimina cogulos o tejidos grandes.   La hemorragia aumenta o se siente mareada dbil o tiene episodios de desmayos.   Comienza a sentir escalofros.   Tiene una prdida de lquido por la vagina.   No puede mover el intestino. Podra tratarse de un embarazo ectpico.  Document Released: 05/12/2005 Document Revised: 07/22/2011 [facility] Patient Information 2012 [place], [place].

## 2011-12-08 ENCOUNTER — Ambulatory Visit (INDEPENDENT_AMBULATORY_CARE_PROVIDER_SITE_OTHER): Payer: Self-pay | Admitting: Obstetrics and Gynecology

## 2011-12-08 ENCOUNTER — Encounter: Payer: Self-pay | Admitting: Obstetrics and Gynecology

## 2011-12-08 VITALS — BP 107/73 | Temp 99.3°F | Wt 201.8 lb

## 2011-12-08 DIAGNOSIS — Z3490 Encounter for supervision of normal pregnancy, unspecified, unspecified trimester: Secondary | ICD-10-CM

## 2011-12-08 DIAGNOSIS — O09299 Supervision of pregnancy with other poor reproductive or obstetric history, unspecified trimester: Secondary | ICD-10-CM

## 2011-12-08 DIAGNOSIS — O34219 Maternal care for unspecified type scar from previous cesarean delivery: Secondary | ICD-10-CM

## 2011-12-08 DIAGNOSIS — Z98891 History of uterine scar from previous surgery: Secondary | ICD-10-CM | POA: Insufficient documentation

## 2011-12-08 DIAGNOSIS — O0991 Supervision of high risk pregnancy, unspecified, first trimester: Secondary | ICD-10-CM

## 2011-12-08 LAB — POCT URINALYSIS DIP (DEVICE)
Bilirubin Urine: NEGATIVE
Glucose, UA: NEGATIVE mg/dL
Ketones, ur: NEGATIVE mg/dL
Specific Gravity, Urine: 1.025 (ref 1.005–1.030)

## 2011-12-08 MED ORDER — PRENAT VIT-FEPOLY-METHYLFOL-FA 29-1.13-0.4 MG PO TABS: 1.0000 | ORAL_TABLET | ORAL | Status: DC

## 2011-12-08 NOTE — Progress Notes (Signed)
Pulse 81 Has cramping on her left side Last pap smear Aug 2012- normal per pt. Needs rx for PNV.

## 2011-12-08 NOTE — Patient Instructions (Signed)
Insuficiencia cervical (Cervical Insufficiency) Se denomina insuficiencia cervical cuando el cuello del tero no es lo suficientemente fuerte como para mantener al beb (feto) en el interior del tero. Se produce a comienzos del 2do. o 3er. trimestre del embarazo. El cuello del tero se agranda (se dilata) sin contracciones. Cuando esto ocurre, las [employer] rodean al feto generalmente protruyen dentro de la canal del parto (vagina). Las [employer] romperse, lo que podra hacer que se interrumpa el embarazo (aborto espontneo). CAUSAS Generalmente no se conoce su causa. Las causas posibles son:  Lesiones en el cuello del tero debido a un embarazo anterior.   Lesiones en el cuello del tero debido a una ciruga anterior.   Tener un defecto congnito en el tero.   Cono fro, rayos lser o procedimientodeescisincon electrocauterio en asaen el cuello del tero.   [place] Moos dilatacin del tero durante un aborto.   Geralyn Flash expuesta al dietilstilbestrol durante el embarazo.   Falta de tejido (elastina y colgeno) en el cuello del tero para mantener el beb en el interior del mismo.   Cuello del tero ms corto que lo normal.  SNTOMAS  Manchas de sangre o hemorragia por la vagina.   Sensacin de presin vaginal.   Hemorragia vaginal anormal.  DIAGNSTICO  En muchos casos, el diagnstico no se realiza [employer] se pierde.   Generalmente el diagnstico se realiza a travs del examen.   En algunos casos es de utilidad realizar una ecografa del cuello del tero. La ecografa mide y sigue el largo del cuello del tero en las mujeres que tiene ms riesgo de sufrir insuficiencia cervical.   El mdico podr hacer un seguimiento de la dilatacin del cuello. Generalmente el diagnstico no puede establecerse hasta que el problema ocurre. Cuando ste es el caso, hay muchas probabilidades de perder Firefighter. Esto significa que el beb ha nacido [employer] para sobrevivir fuera de su madre.  PREVENCIN  Las pacientes de alto riesgo deben someterse a exmenes vaginales frecuentes y a Scientist, water quality.   Una sutura, como un frunce, alrededor del cuello del tero (cerclaje) antes de quedar embarazada.  TRATAMIENTO Cuando se diagnostica precozmente una incompetencia cervical, el tratamiento es un cerclaje. El Scientist, forensic un sostn adicional al cuello. Permite que el beb llegue a trmino. Generalmente se realiza antes del primer trimestre (entre las semanas 12 y 14). Generalmente no se realiza despus del segundo trimestre (24 semanas) excepto que haya una emergencia. El Biomedical engineer los riesgos de este procedimiento. El cerclaje puede retirarse cuando comienza el [place] de parto o cuando el embarazo est a trmino, antes que el [place] de parto se inicie. Tambin la sutura puede dejarse en el lugar para futuros embarazos. Si se deja en el lugar, el beb es extrado en una operacin cesrea. INSTRUCCIONES PARA EL CUIDADO DOMICILIARIO  Cumpla con todos los controles prenatales tal como se le indic.   Tome los [employer] como le indic el mdico.   Evite las actividades fsicas, ejercicios y Associate Professor sexuales hasta que el mdico la autorice.   No se de duchas vaginales ni use tampones.   Reanude su dieta habitual.  SOLICITE ATENCIN MDICA SI: Brett Fairy hemorragia vaginal anormal. SOLICITE ATENCIN MDICA DE INMEDIATO SI:  Tiene fiebre.   Tiene contracciones uterinas.   No siente los movimientos del beb, o ste no se mueve tanto como lo haca habitualmente.   Se desmaya.   Presenta una hemorragia vaginal abundante.  Tiene una prdida importante de lquido por la vagina.   Aparece sangre en la orina o dolor al [employer].  Document Released: 08/02/2005 Document Revised: 07/22/2011 [facility] Patient Information 2012 [place], [place].

## 2011-12-08 NOTE — Progress Notes (Signed)
DT not heard (has had +FH on early [place]) Ur cult, HIV, ABS, 1 hr OGTT today Rx PNV  Subjective:    [name] is a [id number] [redacted]w[redacted]d being seen today for her first obstetrical visit.  Her obstetrical history is significant for 19 wk SAB with last pregnnacy. Patient does intend to breast feed. Pregnancy history fully reviewed.  Patient reports intermittetn left groin cramps.  Filed Vitals:   12/08/11 0936  BP: 107/73  Temp: 99.3 F (37.4 C)  Weight: 201 lb 12.8 oz (91.536 kg)    HISTORY: OB History    Grav Para Term Preterm Abortions TAB SAB Ect Mult Living   4 2 2  0 1 0 1 0 0 2     # Outc Date GA Lbr Len/2nd Wgt Sex Del Anes PTL Lv   1 TRM 10/01   7lb(3.175kg) F SVD EPI  Yes   Comments: Pt states that baby was born at 8 months.   2 TRM 7/06 [redacted]w[redacted]d  9lb6oz(4.252kg)  CS EPI  Yes   3 SAB            4 CUR              Past Medical History  Diagnosis Date  . No pertinent past medical history    Past Surgical History  Procedure Date  . Dilation and curettage of uterus   . Cholecystectomy   . Cesarean section    Family History  Problem Relation Age of Onset  . Anesthesia problems Neg Hx      Exam    Uterus:     Pelvic Exam:    Perineum: Normal Perineum   Vulva: normal, Bartholin's, Urethra, Skene's normal   Vagina:  normal mucosa, normal discharge       Cervix: post, long, closed   Adnexa: normal adnexa and no mass, fullness, tenderness   Bony Pelvis: average  System: Breast:  normal appearance, no masses or tenderness, Inspection negative   Skin: normal coloration and turgor, no rashes    Neurologic: oriented, normal   Extremities: normal strength, tone, and muscle mass, no deformities   HEENT thyroid without masses   Mouth/Teeth mucous membranes moist, pharynx normal without lesions and dental hygiene good   Neck supple   Cardiovascular: regular rate and rhythm   Respiratory:  appears well, vitals normal, no respiratory distress, acyanotic, normal RR, ear  and throat exam is normal, neck free of mass or lymphadenopathy, chest clear, no wheezing, crepitations, rhonchi, normal symmetric air entry   Abdomen: soft, non-tender; bowel sounds normal; no masses,  no organomegaly   Urinary: urethral meatus normal      Assessment:    Pregnancy: [id number] Patient Active Problem List  Diagnoses  . Supervision of high risk pregnancy in first trimester  19 wk SAB with P3 Prev C/S with P2      Plan:     Initial labs drawn. Prenatal vitamins. Problem list reviewed and updated. Genetic Screening discussed First Screen and Integrated Screen: declined.  Ultrasound discussed; fetal survey: requested.  Follow up in 2 weeks. 50% of37min visit spent on counseling and coordination of care.  F/U 2wk HRC consideration for  DT FH and consideration for cercalge    [name] 12/08/2011

## 2011-12-09 LAB — HIV ANTIBODY (ROUTINE TESTING W REFLEX): HIV: NONREACTIVE

## 2011-12-11 LAB — CULTURE, OB URINE: Colony Count: 75000

## 2011-12-23 ENCOUNTER — Other Ambulatory Visit: Payer: Self-pay | Admitting: Obstetrics & Gynecology

## 2011-12-23 ENCOUNTER — Ambulatory Visit (INDEPENDENT_AMBULATORY_CARE_PROVIDER_SITE_OTHER): Payer: Self-pay | Admitting: Obstetrics and Gynecology

## 2011-12-23 VITALS — BP 98/65 | Temp 97.0°F | Wt 202.4 lb

## 2011-12-23 DIAGNOSIS — O99810 Abnormal glucose complicating pregnancy: Secondary | ICD-10-CM | POA: Insufficient documentation

## 2011-12-23 DIAGNOSIS — Z23 Encounter for immunization: Secondary | ICD-10-CM

## 2011-12-23 DIAGNOSIS — Z98891 History of uterine scar from previous surgery: Secondary | ICD-10-CM

## 2011-12-23 DIAGNOSIS — O3430 Maternal care for cervical incompetence, unspecified trimester: Secondary | ICD-10-CM

## 2011-12-23 DIAGNOSIS — R7309 Other abnormal glucose: Secondary | ICD-10-CM

## 2011-12-23 DIAGNOSIS — O09299 Supervision of pregnancy with other poor reproductive or obstetric history, unspecified trimester: Secondary | ICD-10-CM

## 2011-12-23 DIAGNOSIS — Z9889 Other specified postprocedural states: Secondary | ICD-10-CM

## 2011-12-23 DIAGNOSIS — R319 Hematuria, unspecified: Secondary | ICD-10-CM

## 2011-12-23 DIAGNOSIS — O3431 Maternal care for cervical incompetence, first trimester: Secondary | ICD-10-CM

## 2011-12-23 DIAGNOSIS — O0991 Supervision of high risk pregnancy, unspecified, first trimester: Secondary | ICD-10-CM

## 2011-12-23 MED ORDER — INFLUENZA VIRUS VACC SPLIT PF IM SUSP: 0.5000 mL | INTRAMUSCULAR | Status: DC

## 2011-12-23 MED ORDER — INFLUENZA VIRUS VACC SPLIT PF IM SUSP
0.5000 mL | Freq: Once | INTRAMUSCULAR | Status: AC
  Administered 2011-12-23: 0.5 mL via INTRAMUSCULAR

## 2011-12-23 NOTE — Progress Notes (Signed)
Reviewed genetic screening options and declines. Does want to proceed with prophylactic cerclage. Needs 3 hr OGTT  > scheduled 5/15 and will bring 24 hr urine for pro and cr cl with that visit and get Chem 24, Hgb A1C, TSH drawn.  Physical and Pap done (had viability scan last visit)   Objective: General: obese, NAD Breasts: sym, no discrete mass or nipple discharge Neck: thyroid ULNS Cor: RRR, no m Lungs: CTAB Abd NT, fundus 1/2 to u Pelvic: NEFG, physiologic d/c, Pap sent Bimanual: cx thick, closed, S equals D Ext: no calf tenderness or edema  A/P: Abnl OGTT> Diet  Reviewed, pamphlet given,  and F/U tests as above Cx insufficiency > cerclage with Dr. Debroah Loop 01/05/12 AMA> declines genetic testing Prev C/S> information on TOL vs. RC/S given

## 2011-12-23 NOTE — Progress Notes (Signed)
Flu shot desired

## 2011-12-23 NOTE — Progress Notes (Signed)
Addended by: Sherre Lain A on: 12/23/2011 10:51 AM   Modules accepted: Orders

## 2011-12-23 NOTE — Progress Notes (Signed)
Pulse- 90  Pain-ligament

## 2011-12-23 NOTE — Progress Notes (Signed)
Addended by: Faythe Casa on: 12/23/2011 03:24 PM   Modules accepted: Orders

## 2011-12-23 NOTE — Patient Instructions (Signed)
Diabetes y Doroteo Glassman fsica (Diabetes and Exercise) La actividad fsica regular es muy importante y [place] a:   Chief Operating Officer el nivel de glucosa en sangre (azcar).   Disminuir la presin arterial.   Controlar el colesterol en sangre (colesterol y triglicridos).   Mejorar el estado de salud general.  BENEFICIOS DE LA ACTIVIDAD FSICA  Mejora el buen estado fsico.   Mejora la flexibilidad.   Aumenta la resistencia.   Aumenta la densidad sea.   Favorece el control del peso.   Aumenta la fuerza muscular.   Disminuye la Art gallery manager.   Mejora la utilizacin de la insulina por parte del organismo.   Aumenta la sensibilidad a la insulina.   Reduce las necesidades de insulina.   Har que se sienta mejor.   Reduce el estrs y las tensiones.  Las personas diabticas que incorporan la actividad fsica a su estilo de vida obtienen beneficios adicionales.  Prdida de peso.   Reduccin del apetito.   Mejora la utilizacin de la glucosa por parte del organismo.   Disminuye los factores de riesgo para las enfermedades cardacas.   Disminuye el colesterol y los triglicridos.   Eleva el nivel de colesterol bueno (lipoproteinas de alta densidad HDL).   Disminuye el nivel de Banker.   Disminuye la presin arterial.  DIABETES TIPO I Y ACTIVIDAD FSICA  La actividad fsica disminuir el nivel de glucosa en [place].   Si el nivel de glucosa en sangre es de ms de 240 mg/dl, controle las cetonas en la [place]. Si hay cetonas, no realice actividad fisica.   El sitio de inyeccin de la insulina puede requerir un ajuste cuando se realiza actividad fsica. Evite inyectarse insulina en las zonas del cuerpo que ejercitar. Por ejemplo, evite inyectarse insulina en:   Los brazos, si juega al tenis.   Las piernas, si corre. Para obtener ms informacion, consulte a su mdico.   Lleve un registro de:   La ingesta de alimentos.   El tipo y cantidad de [place].     Los momentos esperables de picos de accin de la insulina.   Los niveles de Event organiser.  Hgalo antes, durante y despus de Printmaker actividad fsica. Verifique los registros junto con su mdico. Esto ser de utilidad para la confeccin de pautas para ajustar la ingesta de alimentos o las cantidades de [place].  DIABETES TIPO 2 Y ACTIVIDAD FSICA  La actividad fsica regular ayuda a controlar el nivel de glucosa en [place].   La actividad fsica es importante porque:   Aumenta la sensibilidad del organismo a la insulina.   Mejora el control del nivel de Event organiser.   Reduce el riesgo de enfermedades cardiacas. Disminuye el colesterol srico y los triglicridos. Disminuye la presin arterial.   Las personas que reciben insulina o agentes hipoglucemiantes por va oral deben controlar la aparicin de signos de hipoglucemia (mareos, temblores, sudoracin, escalofros y confusin).   Durante la actividad fsica se pierde Data processing manager. Esta prdida de lquidos debe reponerse. De este modo se evita la prdida de lquidos corporales (deshidratacin) y el golpe de Airline pilot.  Comente con su mdico antes de comenzar un programa de actividad fsica para verificar que sea seguro para usted. Recuerde, cualquier actividad es mejor que ninguna.  Document Released: 08/22/2007 Document Revised: 07/22/2011 [facility] Patient Information 2012 [place], [place].Cerclaje cervical (Cerclage of the Cervix) El cerclaje cervical es un procedimiento quirrgico en caso de un cuello incompetente. Un cuello incompetente es aquel  que no puede mantener un embarazo dentro del tero y se abre antes del comienzo del [place]. En el cerclaje se sutura el cuello cerrado Academic librarian. COMENTE CON SU MDICO:  Si sufre alergias a medicamentos o alimentos.   Todos los medicamentos de [address], prescriptos, hierbas, gotas oculares o cremas que est [place].   Si toma drogas ilegales o consume  alcohol en exceso.   Resfrios o una infeccines.   Problemas anteriores debido a anestsicos o a Careers adviser.   Antecedentes de cogulos sanguneos o hemorragias anormales.   Cualquier problema mdico o de salud.  RIESGOS Y COMPLICACIONES  Infecciones   Hemorragias.   Ruptura de [place].   Trabajo de parto o parto prematuros   Problemas con la anestesia.   Infecciones en el saco amnitico (membranas).  ANTES DEL PROCEDIMIENTO  No tome aspirina   No debe comer ni beber nada durante las 8 horas previas al examen.   No fume.   Si va a ser [employer] mismo da del procedimiento, concurra al menos 60 minutos antes de la ciruga para Oceanographer los formularios necesarios y prepararse.   Habr una zona de espera disponible para sus familiares y amigos.  PROCEDIMIENTO  Le aplicarn una va intravenosa y medicamentos para que se relaje.   Luego la harn dormir aplicndole un anestsico general.   Le darn puntos dentro y alrededor del cuello para ajustarlo y [place].  DESPUES DEL PROCEDIMIENTO  La llevarn a una sala de recuperacin en la que usted y el beb sern monitoreados y luego.   Cuando puedan moverla con seguridad para usted y el beb, la llevarn a una habitacin en el hospital.   En general se indica una inyeccin de progesterona para evitar las contracciones uterinas.   Posiblemente deba pasar la noche en el hospital.   Es una buena idea pedirle a alguien que conduzca hasta su casa y Surveyor, mining con usted durante uno o [address].   Es posible que le indiquen medicamentos para cuando regrese a Pensions consultant.  INSTRUCCIONES PARA EL CUIDADO DOMICILIARIO  Tome slo medicamentos de venta libre o prescriptos, para Engineer, materials, las molestias o para Personal assistant fiebre, segn las indicaciones del mdico.   Evite la actividad fsica y los ejercicios hasta que su mdico la autorice.   Siga con su dieta habitual.   No  tome duchas vaginales.   No tenga relaciones sexuales hasta que el mdico la autorice.   Cumpla con los controles quirrgicos y prenatales con su mdico.  SOLICITE ANTENCIN MDICA SI:  Brett Fairy hemorragia vaginal anormal.   Aparece una erupcin cutnea.   Tiene problemas con los medicamentos.   Si se siente confundida o desfalleciente.  SOLICITE ATENCIN MDICA DE INMEDIATO SI:  Presenta una hemorragia vaginal abundante.   Tiene una prdida importante de lquido de la vagina.   Su temperatura se eleva por encima de 102 F (38.9 C).   Se desmaya.   Tiene contracciones uterinas.   Siente que el beb no se mueve como lo hace habitualmente o usted no siente sus movimientos.  Document Released: 10/29/2008 Document Revised: 07/22/2011 [facility] Patient Information 2012 [place], [place].Diabetes and Exercise Regular exercise is important and can help:   Control blood glucose (sugar).   Decrease blood pressure.    Control blood lipids (cholesterol, triglycerides).   Improve overall health.  BENEFITS FROM EXERCISE  Improved fitness.   Improved flexibility.  Improved endurance.   Increased bone density.   Weight control.   Increased muscle strength.   Decreased body fat.   Improvement of the body's use of insulin, a hormone.   Increased insulin sensitivity.   Reduction of insulin needs.   Reduced stress and tension.   Helps you feel better.  People with diabetes who add exercise to their lifestyle gain additional benefits, including:  Weight loss.   Reduced appetite.   Improvement of the body's use of blood glucose.   Decreased risk factors for heart disease:   Lowering of cholesterol and triglycerides.   Raising the level of good cholesterol (high-density lipoproteins, HDL).   Lowering blood sugar.   Decreased blood pressure.  TYPE 1 DIABETES AND EXERCISE  Exercise will usually lower your blood glucose.   If blood glucose is greater  than 240 mg/dl, check urine ketones. If ketones are present, do not exercise.   Location of the insulin injection sites may need to be adjusted with exercise. Avoid injecting insulin into areas of the body that will be exercised. For example, avoid injecting insulin into:   The arms when playing tennis.   The legs when jogging. For more information, discuss this with your caregiver.   Keep a record of:   Food intake.   Type and amount of exercise.   Expected peak times of insulin action.   Blood glucose levels.  Do this before, during, and after exercise. Review your records with your caregiver. This will help you to develop guidelines for adjusting food intake and insulin amounts.  TYPE 2 DIABETES AND EXERCISE  Regular physical activity can help control blood glucose.   Exercise is important because it may:   Increase the body's sensitivity to insulin.   Improve blood glucose control.   Exercise reduces the risk of heart disease. It decreases serum cholesterol and triglycerides. It also lowers blood pressure.   Those who take insulin or oral hypoglycemic agents should watch for signs of hypoglycemia. These signs include dizziness, shaking, sweating, chills, and confusion.   Body water is lost during exercise. It must be replaced. This will help to avoid loss of body fluids (dehydration) or heat stroke.  Be sure to talk to your caregiver before starting an exercise program to make sure it is safe for you. Remember, any activity is better than none.  Document Released: 10/23/2003 Document Revised: 07/22/2011 Document Reviewed: 02/06/2009 [facility] Patient Information 2012 [place], [place].

## 2011-12-28 ENCOUNTER — Other Ambulatory Visit: Payer: Self-pay

## 2011-12-28 DIAGNOSIS — O09299 Supervision of pregnancy with other poor reproductive or obstetric history, unspecified trimester: Secondary | ICD-10-CM

## 2011-12-28 DIAGNOSIS — O0991 Supervision of high risk pregnancy, unspecified, first trimester: Secondary | ICD-10-CM

## 2011-12-28 DIAGNOSIS — R7309 Other abnormal glucose: Secondary | ICD-10-CM

## 2011-12-28 DIAGNOSIS — R319 Hematuria, unspecified: Secondary | ICD-10-CM

## 2011-12-28 LAB — CBC
HCT: 37.8 % (ref 36.0–46.0)
Hemoglobin: 12.3 g/dL (ref 12.0–15.0)
MCH: 28.1 pg (ref 26.0–34.0)
MCHC: 32.5 g/dL (ref 30.0–36.0)
MCV: 86.3 fL (ref 78.0–100.0)
RBC: 4.38 MIL/uL (ref 3.87–5.11)

## 2011-12-28 NOTE — Progress Notes (Signed)
Patient is Spanish speaking and will need interpreter.  PAT appt. Scheduled on Wednesday 12/29/11 at 2:15pm.  Used Spanish [employer] (via phone) who is currently with the patient in the OB/GYN clinic now for her 3 hour glucose testing.  Will inform Gaylyn Rong that we will need Spanish Interpreter for this PAT appt on Wednesday.

## 2011-12-29 ENCOUNTER — Encounter (HOSPITAL_COMMUNITY): Payer: Self-pay

## 2011-12-29 ENCOUNTER — Encounter: Payer: Self-pay | Admitting: Family Medicine

## 2011-12-29 ENCOUNTER — Encounter (HOSPITAL_COMMUNITY)
Admission: RE | Admit: 2011-12-29 | Discharge: 2011-12-29 | Disposition: A | Discharge status: Home / Self Care | Payer: Self-pay | Source: Ambulatory Visit | Attending: Obstetrics & Gynecology | Admitting: Obstetrics & Gynecology

## 2011-12-29 LAB — COMPREHENSIVE METABOLIC PANEL
AST: 14 U/L (ref 0–37)
Alkaline Phosphatase: 63 U/L (ref 39–117)
BUN: 10 mg/dL (ref 6–23)
Creat: 0.41 mg/dL — ABNORMAL LOW (ref 0.50–1.10)
Glucose, Bld: 100 mg/dL — ABNORMAL HIGH (ref 70–99)
Potassium: 3.9 mEq/L (ref 3.5–5.3)
Total Bilirubin: 0.3 mg/dL (ref 0.3–1.2)

## 2011-12-29 LAB — CREATININE CLEARANCE, URINE, 24 HOUR
Creatinine Clearance: 236 mL/min — ABNORMAL HIGH (ref 75–115)
Creatinine: 0.43 mg/dL — ABNORMAL LOW (ref 0.50–1.10)

## 2011-12-29 LAB — PROTEIN, URINE, 24 HOUR
Protein, 24H Urine: 300 mg/d — ABNORMAL HIGH (ref 50–100)
Protein, Urine: 12 mg/dL

## 2011-12-29 LAB — GLUCOSE TOLERANCE, 3 HOURS
Glucose Tolerance, 1 hour: 178 mg/dL (ref 70–189)
Glucose Tolerance, 2 hour: 189 mg/dL — ABNORMAL HIGH (ref 70–164)
Glucose Tolerance, Fasting: 92 mg/dL (ref 70–104)

## 2011-12-29 NOTE — Patient Instructions (Addendum)
20 [name]  12/29/2011   Your procedure is scheduled on:  01/05/12  Enter through the Main Entrance of [facility] at 830 AM.  Pick up the phone at the desk and dial [phone].   Call this number if you have problems the morning of surgery: [phone]   Remember:   Do not eat food:After Midnight.  Do not drink clear liquids: After Midnight.  Take these medicines the morning of surgery with A SIP OF WATER: NA   Do not wear jewelry, make-up or nail polish.  Do not wear lotions, powders, or perfumes. You may wear deodorant.  Do not shave 48 hours prior to surgery.  Do not bring valuables to the hospital.  Contacts, dentures or bridgework may not be worn into surgery.  Leave suitcase in the car. After surgery it may be brought to your room.  For patients admitted to the hospital, checkout time is 11:00 AM the day of discharge.   Patients discharged the day of surgery will not be allowed to drive home.  Name and phone number of your driver: [phone]  Special Instructions: CHG Shower Use Special Wash: 1/2 bottle night before surgery and 1/2 bottle morning of surgery.   Please read over the following fact sheets that you were given: Surgical Site Infection Prevention

## 2011-12-30 ENCOUNTER — Other Ambulatory Visit: Payer: Self-pay | Admitting: Obstetrics & Gynecology

## 2012-01-04 MED ORDER — CEFAZOLIN SODIUM-DEXTROSE 2-3 GM-% IV SOLR
2.0000 g | INTRAVENOUS | Status: AC
  Administered 2012-01-05: 2 g via INTRAVENOUS
  Filled 2012-01-04: qty 50

## 2012-01-05 ENCOUNTER — Encounter (HOSPITAL_COMMUNITY): Payer: Self-pay | Admitting: Obstetrics & Gynecology

## 2012-01-05 ENCOUNTER — Encounter (HOSPITAL_COMMUNITY): Payer: Self-pay | Admitting: *Deleted

## 2012-01-05 ENCOUNTER — Ambulatory Visit (HOSPITAL_COMMUNITY)
Admission: RE | Admit: 2012-01-05 | Discharge: 2012-01-05 | Disposition: A | Discharge status: Home / Self Care | Payer: Self-pay | Source: Ambulatory Visit | Attending: Obstetrics & Gynecology | Admitting: Obstetrics & Gynecology

## 2012-01-05 ENCOUNTER — Encounter (HOSPITAL_COMMUNITY)
Admission: RE | Disposition: A | Discharge status: Home / Self Care | Payer: Self-pay | Source: Ambulatory Visit | Attending: Obstetrics & Gynecology

## 2012-01-05 ENCOUNTER — Encounter (HOSPITAL_COMMUNITY): Payer: Self-pay | Admitting: Anesthesiology

## 2012-01-05 ENCOUNTER — Ambulatory Visit (HOSPITAL_COMMUNITY): Payer: Self-pay | Admitting: Anesthesiology

## 2012-01-05 DIAGNOSIS — O3430 Maternal care for cervical incompetence, unspecified trimester: Secondary | ICD-10-CM | POA: Insufficient documentation

## 2012-01-05 DIAGNOSIS — Z01812 Encounter for preprocedural laboratory examination: Secondary | ICD-10-CM | POA: Insufficient documentation

## 2012-01-05 DIAGNOSIS — Z01818 Encounter for other preprocedural examination: Secondary | ICD-10-CM | POA: Insufficient documentation

## 2012-01-05 HISTORY — PX: CERVICAL CERCLAGE: SHX1329

## 2012-01-05 SURGERY — CERCLAGE, CERVIX, VAGINAL APPROACH
Anesthesia: Spinal | Site: Cervix | Wound class: Clean Contaminated

## 2012-01-05 MED ORDER — HYDROCODONE-ACETAMINOPHEN 5-500 MG PO TABS: 1.0000 | ORAL_TABLET | Freq: Four times a day (QID) | ORAL | Status: AC | PRN

## 2012-01-05 MED ORDER — FENTANYL CITRATE 0.05 MG/ML IJ SOLN: 25.0000 ug | INTRAMUSCULAR | Status: DC | PRN

## 2012-01-05 MED ORDER — LACTATED RINGERS IV SOLN
INTRAVENOUS | Status: DC
  Administered 2012-01-05 (×3): via INTRAVENOUS

## 2012-01-05 MED ORDER — MEPERIDINE HCL 25 MG/ML IJ SOLN
6.2500 mg | INTRAMUSCULAR | Status: DC | PRN
Start: 2012-01-05 — End: 2012-01-05

## 2012-01-05 MED ORDER — LACTATED RINGERS IV SOLN: INTRAVENOUS | Status: DC

## 2012-01-05 MED ORDER — BUPIVACAINE IN DEXTROSE 0.75-8.25 % IT SOLN
INTRATHECAL | Status: DC | PRN
  Administered 2012-01-05: 1.2 mL via INTRATHECAL

## 2012-01-05 MED ORDER — ONDANSETRON HCL 4 MG/2ML IJ SOLN: 4.0000 mg | Freq: Once | INTRAMUSCULAR | Status: DC | PRN

## 2012-01-05 MED ORDER — HYDROCODONE-ACETAMINOPHEN 5-325 MG PO TABS
1.0000 | ORAL_TABLET | Freq: Once | ORAL | Status: AC
  Administered 2012-01-05: 1 via ORAL

## 2012-01-05 MED ORDER — KETOROLAC TROMETHAMINE 30 MG/ML IJ SOLN: 15.0000 mg | Freq: Once | INTRAMUSCULAR | Status: DC | PRN

## 2012-01-05 MED ORDER — HYDROCODONE-ACETAMINOPHEN 5-325 MG PO TABS
ORAL_TABLET | ORAL | Status: AC
  Filled 2012-01-05: qty 1

## 2012-01-05 SURGICAL SUPPLY — 15 items
CANISTER SUCTION 2500CC (MISCELLANEOUS) ×2 IMPLANT
CATH ROBINSON RED A/P 16FR (CATHETERS) IMPLANT
CLOTH BEACON ORANGE TIMEOUT ST (SAFETY) ×2 IMPLANT
COUNTER NEEDLE 1200 MAGNETIC (NEEDLE) ×2 IMPLANT
GLOVE BIO SURGEON STRL SZ 6.5 (GLOVE) ×2 IMPLANT
GLOVE BIOGEL PI IND STRL 7.0 (GLOVE) ×2 IMPLANT
GLOVE BIOGEL PI INDICATOR 7.0 (GLOVE) ×2
GLOVE ECLIPSE 6.0 STRL STRAW (GLOVE) ×1 IMPLANT
NEEDLE MAYO .5 CIRCLE (NEEDLE) ×2 IMPLANT
NS IRRIG 1000ML POUR BTL (IV SOLUTION) ×2 IMPLANT
PACK VAGINAL MINOR WOMEN LF (CUSTOM PROCEDURE TRAY) ×2 IMPLANT
PAD PREP 24X48 CUFFED NSTRL (MISCELLANEOUS) ×2 IMPLANT
SUT MERSILENE 5MM BP 1 12 (SUTURE) ×3 IMPLANT
TUBING NON-CON 1/4 X 20 CONN (TUBING) ×2 IMPLANT
YANKAUER SUCT BULB TIP NO VENT (SUCTIONS) ×2 IMPLANT

## 2012-01-05 NOTE — H&P (Signed)
Silvia Hightower is an 36 y.o. female. Presents for prophylactic cerclage at 13.[redacted] weeks gestation. 19 weeks loss 2010 cervical incompetence Pertinent Gynecological History: Menses: currently pregnant Bleeding: none Contraception: pregnant DES exposure: denies Blood transfusions: none Sexually transmitted diseases: no past history Previous GYN Procedures:   Last mammogram:  Date:  Last pap: normal Date:  OB History: G4, P2012   Menstrual History: Menarche age:  Patient's last menstrual period was 10/01/2011.    Past Medical History  Diagnosis Date  . No pertinent past medical history     Past Surgical History  Procedure Date  . Dilation and curettage of uterus   . Cholecystectomy   . Cesarean section     Family History  Problem Relation Age of Onset  . Anesthesia problems Neg Hx     Social History:  reports that she has never smoked. She has never used smokeless tobacco. She reports that she does not drink alcohol or use illicit drugs.  Allergies: No Known Allergies  Prescriptions prior to admission  Medication Sig Dispense Refill  . Prenat Vit-FePoly-Methylfol-FA 29-1.13-0.4 MG TABS Take 1 tablet by mouth 1 day or 1 dose.  30 tablet  5    Review of Systems  Constitutional: Negative.   Respiratory: Negative.   Cardiovascular: Negative.   Genitourinary: Negative.     Blood pressure 107/67, pulse 94, temperature 98.1 F (36.7 C), temperature source Oral, resp. rate 18, last menstrual period 10/01/2011, SpO2 98.00%. Physical Exam  Constitutional: She is oriented to person, place, and time. She appears well-developed. No distress.  HENT:  Head: Normocephalic.  Neck: Normal range of motion.  Cardiovascular: Normal rate.   Respiratory: Effort normal and breath sounds normal.  GI: Soft. She exhibits no mass. There is no tenderness.  Genitourinary:       Deferred  Musculoskeletal: She exhibits no edema.  Neurological: She is alert and oriented to  person, place, and time.  Skin: Skin is warm and dry.  Psychiatric: She has a normal mood and affect. Her behavior is normal.    No results found for this or any previous visit (from the past 24 hour(s)).  No results found.  Assessment/Plan: Cervical incompetence last pregnancy, scheduled for cerclage. Procedure and risk of failure, bleeding, infection, ROM, pain discussed and questions were answered, via interpreter. Signed consent.  Anabelle Bungert 01/05/2012, 9:16 AM

## 2012-01-05 NOTE — Anesthesia Postprocedure Evaluation (Signed)
Anesthesia Post Note  Patient: [name]  Procedure(s) Performed: Procedure(s) (LRB): CERCLAGE CERVICAL (N/A)  Anesthesia type: Spinal  Patient location: PACU  Post pain: Pain level controlled  Post assessment: Post-op Vital signs reviewed  Last Vitals:  Filed Vitals:   01/05/12 1145  BP: 102/50  Pulse: 71  Temp:   Resp: 34    Post vital signs: Reviewed  Level of consciousness: awake  Complications: No apparent anesthesia complications

## 2012-01-05 NOTE — Anesthesia Preprocedure Evaluation (Signed)
Anesthesia Evaluation  Patient identified by MRN, date of birth, ID band Patient awake    Reviewed: Allergy & Precautions, H&P , NPO status , Patient's Chart, lab work & pertinent test results  Airway Mallampati: II TM Distance: >3 FB Neck ROM: full    Dental No notable dental hx.    Pulmonary neg pulmonary ROS,    Pulmonary exam normal       Cardiovascular negative cardio ROS      Neuro/Psych negative neurological ROS  negative psych ROS   GI/Hepatic negative GI ROS, Neg liver ROS,   Endo/Other  Morbid obesity  Renal/GU negative Renal ROS  negative genitourinary   Musculoskeletal   Abdominal (+) + obese,   Peds negative pediatric ROS (+)  Hematology negative hematology ROS (+)   Anesthesia Other Findings   Reproductive/Obstetrics (+) Pregnancy                           Anesthesia Physical Anesthesia Plan  ASA: III  Anesthesia Plan: Spinal   Post-op Pain Management:    Induction:   Airway Management Planned:   Additional Equipment:   Intra-op Plan:   Post-operative Plan:   Informed Consent: I have reviewed the patients History and Physical, chart, labs and discussed the procedure including the risks, benefits and alternatives for the proposed anesthesia with the patient or authorized representative who has indicated his/her understanding and acceptance.     Plan Discussed with: CRNA and Surgeon  Anesthesia Plan Comments:         Anesthesia Quick Evaluation  

## 2012-01-05 NOTE — Anesthesia Procedure Notes (Signed)
Spinal  Patient location during procedure: OR Start time: 01/05/2012 10:10 AM End time: 01/05/2012 10:16 AM Staffing Anesthesiologist: Sandrea Hughs Performed by: anesthesiologist  Preanesthetic Checklist Completed: patient identified, site marked, surgical consent, pre-op evaluation, timeout performed, IV checked, risks and benefits discussed and monitors and equipment checked Spinal Block Patient position: sitting Prep: DuraPrep Patient monitoring: heart rate, cardiac monitor, continuous pulse ox and blood pressure Approach: midline Location: L3-4 Injection technique: single-shot Needle Needle type: Sprotte  Needle gauge: 24 G Needle length: 9 cm Needle insertion depth: 8 cm Assessment Sensory level: T10

## 2012-01-05 NOTE — Discharge Instructions (Signed)
Cerclaje cervical (Cerclage of the Cervix) El cerclaje cervical es un procedimiento quirrgico en caso de un cuello incompetente. Un cuello incompetente es aquel que no puede mantener un embarazo dentro del tero y se abre antes del comienzo del trabajo de parto. En el cerclaje se sutura el cuello cerrado durante el embarazo. COMENTE CON SU MDICO:  Si sufre alergias a medicamentos o alimentos.   Todos los medicamentos de venta libre, prescriptos, hierbas, gotas oculares o cremas que est usando.   Si toma drogas ilegales o consume alcohol en exceso.   Resfrios o una infeccines.   Problemas anteriores debido a anestsicos o a la novocana   Cirugas anteriores.   Antecedentes de cogulos sanguneos o hemorragias anormales.   Cualquier problema mdico o de salud.  RIESGOS Y COMPLICACIONES  Infecciones   Hemorragias.   Ruptura de membranas.   Trabajo de parto o parto prematuros   Problemas con la anestesia.   Infecciones en el saco amnitico (membranas).  ANTES DEL PROCEDIMIENTO  No tome aspirina   No debe comer ni beber nada durante las 8 horas previas al examen.   No fume.   Si va a ser admitida en el hospital el mismo da del procedimiento, concurra al menos 60 minutos antes de la ciruga para firmar los formularios necesarios y prepararse.   Habr una zona de espera disponible para sus familiares y amigos.  PROCEDIMIENTO  Le aplicarn una va intravenosa y medicamentos para que se relaje.   Luego la harn dormir aplicndole un anestsico general.   Le darn puntos dentro y alrededor del cuello para ajustarlo y mantenerlo cerrado.  DESPUES DEL PROCEDIMIENTO  La llevarn a una sala de recuperacin en la que usted y el beb sern monitoreados y luego.   Cuando puedan moverla con seguridad para usted y el beb, la llevarn a una habitacin en el hospital.   En general se indica una inyeccin de progesterona para evitar las contracciones uterinas.    Posiblemente deba pasar la noche en el hospital.   Es una buena idea pedirle a alguien que conduzca hasta su casa y permanezca con usted durante uno o dos das.   Es posible que le indiquen medicamentos para cuando regrese a su casa.  INSTRUCCIONES PARA EL CUIDADO DOMICILIARIO  Tome slo medicamentos de venta libre o prescriptos, para aliviar el dolor, las molestias o para bajar la fiebre, segn las indicaciones del mdico.   Evite la actividad fsica y los ejercicios hasta que su mdico la autorice.   Siga con su dieta habitual.   No tome duchas vaginales.   No tenga relaciones sexuales hasta que el mdico la autorice.   Cumpla con los controles quirrgicos y prenatales con su mdico.  SOLICITE ANTENCIN MDICA SI:  Observa una hemorragia vaginal anormal.   Aparece una erupcin cutnea.   Tiene problemas con los medicamentos.   Si se siente confundida o desfalleciente.  SOLICITE ATENCIN MDICA DE INMEDIATO SI:  Presenta una hemorragia vaginal abundante.   Tiene una prdida importante de lquido de la vagina.   Su temperatura se eleva por encima de 102 F (38.9 C).   Se desmaya.   Tiene contracciones uterinas.   Siente que el beb no se mueve como lo hace habitualmente o usted no siente sus movimientos.  Document Released: 10/29/2008 Document Revised: 07/22/2011 [employer] Patient Information 2012 [employer]. 

## 2012-01-05 NOTE — Transfer of Care (Signed)
Immediate Anesthesia Transfer of Care Note  Patient: [name]  Procedure(s) Performed: Procedure(s) (LRB): CERCLAGE CERVICAL (N/A)  Patient Location: PACU  Anesthesia Type: Spinal  Level of Consciousness: awake, alert , oriented and patient cooperative  Airway & Oxygen Therapy: Patient Spontanous Breathing  Post-op Assessment: Report given to PACU RN and Post -op Vital signs reviewed and stable  Post vital signs: Reviewed and stable  Complications: No apparent anesthesia complications

## 2012-01-05 NOTE — Progress Notes (Signed)
Spanish Interpreter Norvel Richards in [place] for pre-op

## 2012-01-05 NOTE — Op Note (Signed)
Procedure: Cervical cerclage Preoperative diagnosis: Intrauterine pregnancy 13 weeks 5 days gestation with history of incompetent cervix Postoperative diagnosis: Same Surgeon: Dr. Scheryl Darter Anesthesia: Spinal  Estimated blood loss: 25 mL Complications: None Drains: None Counts: Correct   Patient gave written consent for cervical cerclage at 13 weeks 5 days gestation. She had a 19 week pregnancy loss in 2010 with a diagnosis of cervical incompetence. Patient identification was confirmed and she was brought to the operating room. She has received IV Kefzol. Spinal anesthesia was induced. She was placed in dorsal lithotomy position. The perineum and vagina were sterilely prepped and draped. The bladder was drained with catheter. Exam revealed a long closed cervix is a 14 week size uterus. Weighted speculum was placed and Deaver retractors were used to expose cervix. Mersilene tape was used to with a free needle. Sutures were placed in the vaginal mucosa at 30 reflection at the cervix at 12:00 2:00 5:00 7:00 and 10:00. The suture was tied at 6:00. The cervix was checked and the internal os was closed to the passage of a fingertip. There was no excessive bleeding. No sign of ruptured membranes. Patient tolerated the procedure well without complications. She is brought in stable condition to the recovery room.  Dr. Scheryl Darter 01/05/2012 11:15 AM

## 2012-01-06 ENCOUNTER — Encounter (HOSPITAL_COMMUNITY): Payer: Self-pay | Admitting: Obstetrics & Gynecology

## 2012-01-20 ENCOUNTER — Encounter: Payer: Self-pay | Admitting: Obstetrics & Gynecology

## 2012-01-27 ENCOUNTER — Ambulatory Visit (INDEPENDENT_AMBULATORY_CARE_PROVIDER_SITE_OTHER): Payer: Self-pay | Admitting: Family Medicine

## 2012-01-27 VITALS — BP 91/63 | Temp 97.9°F | Wt 207.1 lb

## 2012-01-27 DIAGNOSIS — IMO0002 Reserved for concepts with insufficient information to code with codable children: Secondary | ICD-10-CM

## 2012-01-27 DIAGNOSIS — O1210 Gestational proteinuria, unspecified trimester: Secondary | ICD-10-CM

## 2012-01-27 DIAGNOSIS — O3440 Maternal care for other abnormalities of cervix, unspecified trimester: Secondary | ICD-10-CM

## 2012-01-27 DIAGNOSIS — O26839 Pregnancy related renal disease, unspecified trimester: Secondary | ICD-10-CM

## 2012-01-27 DIAGNOSIS — R809 Proteinuria, unspecified: Secondary | ICD-10-CM

## 2012-01-27 DIAGNOSIS — N289 Disorder of kidney and ureter, unspecified: Secondary | ICD-10-CM

## 2012-01-27 DIAGNOSIS — O3430 Maternal care for cervical incompetence, unspecified trimester: Secondary | ICD-10-CM

## 2012-01-27 DIAGNOSIS — O3431 Maternal care for cervical incompetence, first trimester: Secondary | ICD-10-CM

## 2012-01-27 DIAGNOSIS — O99810 Abnormal glucose complicating pregnancy: Secondary | ICD-10-CM

## 2012-01-27 LAB — POCT URINALYSIS DIP (DEVICE): Ketones, ur: NEGATIVE mg/dL

## 2012-01-27 LAB — HEMOGLOBIN A1C: Hgb A1c MFr Bld: 5.7 % — ABNORMAL HIGH (ref <5.7)

## 2012-01-27 NOTE — Progress Notes (Signed)
Pulse 93 Patient reports occasional abdominal pain and ligament pain. Also reports numbness in right hip since cerclage insertion  Used Delorise Royals for interpreter

## 2012-01-27 NOTE — Progress Notes (Signed)
1 abnl value on 3 hour but all values are right under abnl value--will check A1C Discussed diet and she will follow Having some low abd pain and cramping since cerclage. No bleeding, no LOF Cervix is ftp/1cm long/soft--cerclage in place/LUSD Increase rest

## 2012-01-27 NOTE — Patient Instructions (Addendum)
Diabetes mellitus gestacional (Gestational Diabetes Mellitus) La diabetes mellitus gestacional se produce slo durante el embarazo. Aparece cuando el organismo no puede controlar adecuadamente la glucosa (azcar) que aumenta en la sangre despus de comer. Durante el [place], se produce una resistencia a la insulina (sensibilidad reducida a la insulina) debido a la liberacin de hormonas por parte de la placenta. Generalmente, el pncreas de una mujer embarazada produce la cantidad suficiente de insulina para vencer esa resistencia. Sin embargo, en la diabetes gestacional, hay insulina pero no cumple su funcin adecuadamente. Si la resistencia es lo suficientemente grave como para que el pncreas no produzca la cantidad de insulina suficiente, la glucosa extra se acumula en la sangre.  Devota Pace RIESGO DE DESARROLLAR DIABETES GESTACIONAL?  Las mujeres con historia de diabetes en la familia.   Las mujeres de ms de [address]   Las que presentan sobrepeso.   Las [employer] pertenecen a ciertos grupos tnicos (latinas, afroamericanas, norteamericanas nativas, asiticas y las originarias de las islas del Pacfico.  QUE PUEDE OCURRIRLE AL BEB? Si el nivel de glucosa en sangre de la madre es demasiado elevado mientras este Maish V[place], el nivel extra de azcar pasar por el cordn umbilical hacia el beb. Algunos de los problemas del beb pueden ser:  Beb demasiado grande: si el nio recibe Chief Strategy Officer, puede aumentar mucho de [place]. Esto puede hacer que sea demasiado grande para nacer por parto normal (vaginal) por lo que ser necesario realizar una cesrea.   Bajo nivel de glucosa (hipoglucemia): el beb produce insulina extra en respuesta a la excesiva cantidad de azcar que obtiene de [employer]. Cuando el beb nace y ya no necesita insulina extra, su nivel de azcar en sangre puede disminuir.   Ictericia (coloracin amarillenta de la piel y los ojos): esto es bastante frecuente en los  bebs. La causa es la acumulacin de una sustancia qumica denominada bilirrubina. No siempre es un trastorno grave, pero se observa con frecuencia en los bebs cuyas madres sufren diabetes gestacional.  RIESGOS PARA LA MADRE Las mujeres que han sufrido diabetes gestacional pueden tener ms riesgos para algunos problemas como:  Preeclampsia o toxemia, incluyendo problemas con hipertensin arterial. La presin arterial y los niveles de protenas en la orina deben controlarse con frecuencia.   Infecciones   Parto por cesrea.   Aparicin de diabetes tipo 2 en una etapa posterior de la vida. Alrededor del 30% al 50% sufrir diabetes posteriormente, especialmente las que son obesas.  DIAGNSTICO Las hormonas que causan resistencia a la insulina tienen su mayor nivel alrededor de las 24 a 28 semanas del Psychiatrist. Si se experimentan sntomas, stos son similares a los sntomas que normalmente aparecen durante el embarazo.  La diabetes mellitus gestacional generalmente se diagnostica por medio de un mtodo en dos partes: 1. Despus de la 24 a 28 semanas de Psychiatrist, la mujer debe beber una solucin que contiene glucosa y Education officer, environmental un anlisis de [place]. Si el nivel de glucosa es elevado, la realizarn un segundo Soldier.  2. La prueba oral de tolerancia a la glucosa, que dura aproximadamente tres horas. Despus de realizar ayuno durante la noche, se controla nivel de glucosa en sangre. La mujer bebe una solucin que contiene glucosa y Chief Executive Officer realizan anlisis de glucosa en sangre cada hora.  Si la mujer tiene factores de riesgos para la diabetes mellitus gestacional, el mdico podr Programme researcher, broadcasting/film/video anlisis antes de las 24 semanas de [place]. TRATAMIENTO El tratamiento est dirigido a Insurance underwriter en  sangre de la madre en un nivel normal y puede incluir:  La planificacin de los alimentos.   Recibir insulina u otro medicamento para controlar el nivel de glucosa en sangre.   La prctica de ejercicios.    Llevar un registro diario de los alimentos que consume.   Control y registro de los niveles de glucosa en sangre.   Control de los niveles de cetona en la orina, aunque esto ya no se considera necesario en la mayora de los embarazos.  INSTRUCCIONES PARA EL CUIDADO DOMICILIARIO Mientras est embarazada:  Siga los consejos de su mdico relacionados con los controles prenatales, la planificacin de la comida, la actividad fsica, los medicamentos, vitaminas, los anlisis de sangre y otras pruebas y las actividades fsicas.   Lleve un registro de las comidas, las pruebas de glucosa en sangre y la cantidad de insulina que recibe (si corresponde). Muestre todo al profesional en cada consulta mdica prenatal.   Si sufre diabetes mellitus gestacional, podr tener problemas de hipoglucemia (nivel bajo de glucosa en sangre). Podr sospechar este problema si se siente repentinamente mareada, tiene temblores y/o se siente dbil. Si cree que esto le est ocurriendo, y tiene un medidor de glucosa, mida su nivel de glucosa en sangre. Siga los consejos de su mdico sobre el modo y el momento de tratar su nivel de glucosa en sangre. Generalmente se sigue la regla 15:15 Consuma 15 g de hidratos de carbono, espere 15 minutos y vuelva controlar el nivel de glucosa en sangre.. Ejemplos de 15 g de hidratos de carbono son:   1 taza de leche descremada.    taza de jugo.   3-4 tabletas de glucosa.   5-6 caramelos duros.   1 caja pequea de pasas de uva.    taza de gaseosa comn.   Mantenga una buena higiene para evitar infecciones.   No fume.  SOLICITE ATENCIN MDICA SI:  Observa prdida vaginal con o sin picazn.   Se siente ms dbil o cansada que lo habitual.   Transpira mucho.   Tiene un aumento de peso repentino, 2,5 kg o ms en una semana.   Pierde peso, 1.5 kg o ms en una semana.   Su nivel de glucosa en sangre es elevado, necesita instrucciones.  SOLICITE ATENCIN MDICA DE  INMEDIATO SI:  Sufre una cefalea intensa.   Se marea o pierde el conocimiento   Presenta nuseas o vmitos.   Se siente desorientada confundida.   Sufre convulsiones.   Tiene problemas de visin.   Siente dolor en el estmago.   Presenta una hemorragia vaginal abundante.   Tiene contracciones uterinas.   Tiene una prdida importante de lquido por la vagina  DESPUS QUE NACE EL BEB:  Concurra a todos los controles de seguimiento y realice los anlisis de sangre segn las indicaciones de su mdico.   Mantenga un estilo de vida saludable para evitar la diabetes en el futuro. Aqu se incluye:   Siga el plan de alimentacin saludable.   Controle su peso.   Practique actividad fsica y descanse lo necesario.   No fume.   Amamante a su beb mientras pueda. Esto disminuir la probabilidad de que usted y su beb sufran diabetes posteriormente.  Para ms informacin acerca de la diabetes, visite la pgina web de la American Diabetes Association: www.americandiabetesassociation.org. Para ms informacin acerca de la diabetes gestacional cite la pgina web del American Congress of Obstetricians and Gynecologists en: www.acog.org. Document Released: 05/12/2005 Document Revised: 07/22/2011 ExitCare Patient Information 2012   ExitCare, [employer]. Embarazo - Segundo trimestre (Pregnancy - Second Trimester) El segundo trimestre del Psychiatrist (del 3 al ) es un perodo de evolucin rpida para usted y el beb. Hacia el final del sexto mes, el beb mide aproximadamente 23 cm y pesa 680 g. Comenzar a Pharmacologist del beb [place] 18 y las 20 [address] de [place]. Podr sentir las pataditas ("quickening en ingls"). Hay un rpido [employer]. Puede segregar un lquido claro Charity fundraiser) de las [place]. Quizs sienta pequeas contracciones en el vientre (tero) Esto se conoce como falso trabajo de parto o contracciones de Braxton-H[place]. Es como una prctica del trabajo de parto que se  produce cuando el beb est listo para salir. Generalmente los problemas de vmitos matinales ya se han superado hacia el final del Medical sales representative. Algunas mujeres desarrollan pequeas manchas oscuras (que se denominan cloasma, mscara del embarazo) en la cara que normalmente se van luego del nacimiento del beb. La exposicin al sol empeora las manchas. Puede desarrollarse acn en algunas mujeres embarazadas, y puede desaparecer en aquellas que ya tienen acn. EXAMENES PRENATALES  Durante los Manpower [employer], deber seguir realizando pruebas de [place], segn avance el [place]. Estas pruebas se realizan para controlar su salud y la del beb. Tambin se realizan anlisis de sangre para [employer] niveles de [place]. La anemia (bajo nivel de hemoglobina) es frecuente durante el embarazo. Para prevenirla, se administran hierro y vitaminas. Tambin se le realizarn exmenes para saber si tiene diabetes entre las 24 y las 28 semanas del [place]. Podrn repetirle algunas de las H[employer] Enterprises hicieron previamente.   En cada visita le medirn el tamao del tero. Esto se realiza para asegurarse de que el beb est creciendo correctamente de acuerdo al estado del [place].   Tambin en cada visita prenatal controlarn su presin arterial. Esto se realiza para asegurarse de que no tenga toxemia.   Se controlar su orina para asegurarse de que no tenga infecciones, diabetes o protena en la orina.   Se controlar su peso regularmente para asegurarse que el aumento ocurre al ritmo indicado. Esto se hace para asegurarse que usted y el beb tienen una evolucin normal.   En algunas ocasiones se realiza una prueba de ultrasonido para confirmar el correcto desarrollo y evolucin del beb. Esta prueba se realiza con ondas sonoras inofensivas para el beb, de modo que el profesional pueda calcular ms precisamente la fecha del [place].  Algunas veces se realizan pruebas especializadas del lquido amnitico  que rodea al beb. Esta prueba se denomina amniocentesis. El lquido amnitico se obtiene introduciendo una aguja en el vientre (abdomen). Se realiza para Conservator, museum/gallery en los que existe alguna preocupacin acerca de algn problema gentico que pueda sufrir el beb. En ocasiones se lleva a cabo cerca del final del embarazo, si es necesario inducir al Apple Computer. En este caso se realiza para asegurarse que los pulmones del beb estn lo suficientemente maduros como para que pueda vivir fuera del tero. CAMBIOS QUE OCURREN EN EL SEGUNDO TRIMESTRE DEL EMBARAZO Su organismo atravesar numerosos cambios durante el [employer]. Estos pueden variar de Neomia Dear persona a otra. [place] con el profesional que la asiste acerca los cambios que usted note y que la preocupen.  Durante el segundo trimestre probablemente sienta un aumento del apetito. Es normal tener "antojos" de Development worker, community. Esto vara de Neomia Dear persona a otra y de un embarazo a Therapist, art.   El abdomen inferior comenzar a abultarse.   Podr  tener la necesidad de Geographical information systems officer con ms frecuencia debido a que el tero y el beb presionan sobre la vejiga. Tambin es frecuente contraer ms infecciones urinarias durante el embarazo (dolor al ConocoPhillips). Puede evitarlas bebiendo gran cantidad de lquidos y vaciando la vejiga antes y despus de Sales promotion account executive.   Podrn aparecer las primeras estras en las caderas, abdomen y [place]. Estos son cambios normales del cuerpo durante el [place]. No existen medicamentos ni ejercicios que puedan prevenir [employer].   Es posible que comience a desarrollar venas inflamadas y abultadas (varices) en las piernas. El uso de medias de descanso, Optometrist sus pies durante 15 minutos, 3 a 4 veces al da y Film/video editor la sal en su dieta ayuda a Journalist, newspaper.   Podr sentir Engineering geologist gstrica a medida que el tero crece y Doctor, general practice. Puede tomar anticidos, con la autorizacin de su mdico,  para Financial planner. Tambin es til ingerir pequeas comidas 4 a 5 veces al Futures trader.   La constipacin puede tratarse con un laxante o agregando fibra a su dieta. Beber grandes cantidades de lquidos, comer vegetales, frutas y granos integrales es de [place].   Tambin es beneficioso practicar actividad fsica. Si ha sido una persona Engineer, mining, podr continuar con la Harley[employer]son de las actividades durante el mismo. Si ha sido [employer], puede ser beneficioso que comience con un programa de ejercicios, Museum/gallery exhibitions officer.   Puede desarrollar hemorroides (vrices en el recto) hacia el final del segundo trimestre. Tomar baos de asiento tibios y Chemical engineer cremas recomendadas por el profesional que lo asiste sern de ayuda para los problemas de hemorroides.   Tambin podr Financial risk analyst de espalda durante este momento de su embarazo. Evite levantar objetos pesados, utilice zapatos de taco bajo y [place] buena postura para ayudar a reducir los problemas de [place].   Algunas mujeres embarazadas desarrollan hormigueo y adormecimiento de la mano y los dedos debido a la hinchazn y compresin de los ligamentos de la mueca (sndrome del tnel carpiano). Esto desaparece una vez que el beb nace.   Como sus pechos se agrandan, Pension scheme manager un sujetador ms grande. Use un sostn de soporte, cmodo y de algodn. No utilice un sostn para amamantar hasta el ltimo mes de embarazo si va a amamantar al beb.   Podr observar una lnea oscura desde el ombligo hacia la zona pbica denominada linea nigra.   Podr observar que sus mejillas se ponen coloradas debido al aumento de flujo sanguneo en la cara.   Podr desarrollar "araitas" en la cara, cuello y pecho. Esto desaparece una vez que el beb nace.  INSTRUCCIONES PARA EL CUIDADO DOMICILIARIO  Es extremadamente importante que evite el cigarrillo, hierbas medicinales, alcohol y las drogas no prescriptas durante el Psychiatrist. Estas  sustancias qumicas afectan la formacin y el desarrollo del beb. Evite estas sustancias durante todo el embarazo para asegurar el nacimiento de un beb sano.   La mayor parte de los cuidados que se aconsejan son los mismos que los indicados para Financial risk analyst trimestre del Psychiatrist. Cumpla con las citas tal como se le indic. Siga las instrucciones del profesional que lo asiste con respecto al uso de los medicamentos, el ejercicio y Psychologist, forensic.   Durante el embarazo debe obtener nutrientes para usted y para su beb. Consuma alimentos balanceados a intervalos regulares. Elija alimentos como carne, pescado, [place] y otros productos lcteos descremados, vegetales, frutas, panes integrales y cereales. El Equities trader  cul es el aumento de peso ideal.   Las relaciones sexuales fsicas pueden continuarse hasta cerca del fin del embarazo si no existen otros problemas. Estos problemas pueden ser la prdida temprana (prematura) de lquido amnitico de las [place], sangrado vaginal, dolor abdominal u otros problemas mdicos o del Psychiatrist.   Realice Tesoro Corporation, si no tiene restricciones. Consulte con el profesional que la asiste si no sabe con certeza si determinados ejercicios son seguros. El mayor aumento de peso tiene Environmental consultant durante los ltimos 2 trimestres del Psychiatrist. El ejercicio la ayudar a:   Engineering geologist.   Ponerla en forma para el parto.   Ayudarla a perder peso luego de haber dado a luz.   Use un buen sostn o como los que se usan para hacer deportes para Paramedic la sensibilidad de las [place]. Tambin puede serle til si lo [place] mientras duerme. Si pierde Product manager, podr Parker [employer]in.   No utilice la baera con agua caliente, baos turcos y saunas durante el [address]   Utilice el cinturn de seguridad sin excepcin cuando conduzca. Este la proteger a usted y al beb en caso de accidente.   Evite comer carne cruda, queso crudo, y el contacto  con los utensilios y desperdicios de los gatos. Estos elementos contienen grmenes que pueden causar defectos de nacimiento en el beb.   El segundo trimestre es un buen momento para visitar a su dentista y Software engineer si an no lo ha hecho. Es Primary school teacher los dientes limpios. Utilice un cepillo de dientes blando. Cepllese ms suavemente durante el embarazo.   Es ms fcil perder algo de orina durante el [place]. Apretar y Chief Operating Officer los msculos de la pelvis la ayudar con este problema. Practique detener la miccin cuando est en el bao. Estos son los mismos msculos que Development worker, international aid. Son [employer] Partners mismos msculos que utiliza cuando trata de [employer]der [employer] gases. Puede practicar apretando estos msculos 10 veces, y repetir esto 3 veces por da aproximadamente. Una vez que conozca qu msculos debe apretar, no realice estos ejercicios durante la miccin. Puede favorecerle una infeccin si la orina vuelve hacia atrs.   Pida ayuda si tiene necesidades econmicas, de asesoramiento o nutricionales durante el [place]. El profesional podr ayudarla con respecto a estas necesidades, o derivarla a otros especialistas.   La piel puede ponerse grasa. Si esto sucede, lvese la cara con un jabn [place], utilice un humectante no graso y [place] con base de aceite o crema.  CONSUMO DE MEDICAMENTOS Y DROGAS DURANTE EL EMBARAZO  Contine tomando las vitaminas apropiadas para esta etapa tal como se le indic. Las vitaminas deben contener un miligramo de cido flico y deben suplementarse con hierro. Guarde todas las vitaminas fuera del alcance de los nios. La ingestin de slo un par de vitaminas o tabletas que contengan hierro puede ocasionar la [employer] Mining en un beb o en un nio pequeo.   Evite el uso de [place], inclusive los de venta libre y hierbas que no hayan sido prescriptos o indicados por el profesional que la asiste. Algunos medicamentos pueden causar problemas fsicos al  beb. Utilice los medicamentos de venta libre o de prescripcin para Chief Technology Officer, Environmental health practitioner o la [place], segn se lo indique el profesional que lo asiste. No utilice aspirina.   El consumo de alcohol est relacionado con ciertos defectos de nacimiento. Esto incluye el sndrome de alcoholismo fetal. Debe evitar el consumo de alcohol en cualquiera de sus  formas. El cigarrillo causa nacimientos prematuros y bebs de [place]. El uso de drogas recreativas est absolutamente prohibido. Son muy nocivas para el beb. Un beb que nace de [employer], ser adicto al nacer. Ese beb tendr los mismos sntomas de abstinencia que un adulto.   Infrmele al profesional si consume alguna droga.   No consuma drogas ilegales. Pueden causarle mucho dao al beb.  SOLICITE ATENCIN MDICA SI: Tiene preguntas o preocupaciones durante su embarazo. Es mejor que llame para Science writer las dudas que esperar hasta su prxima visita prenatal. Thressa Sheller forma se sentir ms tranquila.  SOLICITE ATENCIN MDICA DE INMEDIATO SI:  La temperatura oral se eleva sin motivo por encima de 102 F (38.9 C) o segn le indique el profesional que lo asiste.   Tiene una prdida de lquido por la vagina (canal de parto). Si sospecha una ruptura de las [place], tmese la temperatura y llame al profesional para informarlo sobre esto.   Observa unas pequeas manchas, una hemorragia vaginal o elimina cogulos. Notifique al profesional acerca de la cantidad y de cuntos apsitos est utilizando. Unas pequeas manchas de sangre son algo comn durante el Psychiatrist, especialmente despus de Sales promotion account executive.   Presenta un olor desagradable en la secrecin vaginal y observa un cambio en el color, de transparente a blanco.   Contina con las nuseas y no obtiene alivio de los remedios indicados. Vomita sangre o algo similar a la borra del caf.   [place] o sube ms de 900 g. en una semana, o segn lo indicado por el profesional que la  asiste.   Observa que se le Southwest Airlines, las manos, los pies o las piernas.   Ha estado expuesta a la rubola y no ha sufrido la enfermedad.   Ha estado expuesta a la quinta enfermedad o a la varicela.   Presenta dolor abdominal. Las molestias en el ligamento redondo son Neomia Dear causa no cancerosa (benigna) frecuente de dolor abdominal durante el embarazo. El profesional que la asiste deber evaluarla.   Presenta dolor de cabeza intenso que no se [place].   Presenta fiebre, diarrea, dolor al orinar o le falta la respiracin.   Presenta dificultad para ver, visin borrosa, o visin doble.   Sufre una cada, un accidente de trnsito o cualquier tipo de trauma.   Vive en un hogar en el que existe violencia fsica o mental.  Document Released: 05/12/2005 Document Revised: 07/22/2011 [facility] Patient Information 2012 [place], [place]. Amamantar al beb (Breastfeeding) LOS BENEFICIOS DE AMAMANTAR Para el beb  La primera leche (calostro ) ayuda al mejor funcionamiento del sistema digestivo del beb.   La leche tiene anticuerpos que provienen de la madre y que ayudan a prevenir las infecciones en el beb.   Hay una menor incidencia de asma, enfermedades alrgicas y SMSI (sndrome de muerte sbita nfantil).   Los nutrientes que contiene la [place] materna son mejores que las frmulas para el bibern y favorecen el desarrollo cerebral.   Los bebs amamantados sufren menos gases, clicos y constipacin.  Para la mam  La lactancia materna favorece el desarrollo de un vnculo muy especial entre la madre y el beb.   Es ms conveniente, siempre disponible a la Optician, dispensing y ms econmica que la [employer].   Consume caloras en la madre y la ayuda a perder el peso ganado durante el [place].   Favorece la contraccin del tero a su tamao normal, de manera ms rpida y [employer]urant hemorragias luego  del parto.   Las [employer] Holdings que amamantan tienen menor riesgo de  Geophysical data processor de mama.  AMAMNTELO CON FRECUENCIA  Un beb sano, nacido a trmino, puede amamantarse con tanta frecuencia como cada hora, o espaciar las comidas cada tres horas.   Esta frecuencia variar de un beb a otro. Observe al beb cuando manifieste signos de hambre, antes que regirse por el reloj.   Amamntelo tan seguido como el beb lo solicite, o cuando usted sienta la necesidad de Paramedic sus [place].   Despierte al beb si han pasado 3  4 horas desde la ltima comida.   El amamantamiento frecuente la ayudar a producir ms [place] y a Education officer, community de Engineer, mining en los pezones e hinchazn de las [place].  LA POSICIN DEL BEB PARA AMAMANTARLO  Ya sea que se encuentre acostada o sentada, asegrese que el abdomen del beb enfrente el suyo.   Sostenga la mama con el pulgar por arriba y el resto de los dedos por debajo. Asegrese que sus dedos se encuentren lejos del pezn y de la boca del beb.   Toque suavemente los labios del beb y la mejilla ms cercana a la mama con el dedo o el pezn.   Cuando la boca del beb se abra lo suficiente, introduzca el pezn y la zona oscura que lo rodea tanto como le sea posible dentro de la boca.   Coloque a beb cerca suyo de modo que su nariz y mejillas toquen las mamas al [employer].  LAS COMIDAS  La duracin de cada comida vara de un beb a otro y de [place] comida a [place].   El beb debe succionar alrededor American Financial o tres minutos para que le llegue [place]. Esto se denomina "bajada". Por este motivo, permita que el nio se alimente en cada mama todo lo que desee. Terminar de mamar cuando haya recibido la cantidad [place] de nutrientes.   Para detener la succin coloque su dedo en la comisura de la boca del nio y Midwife entre sus encas antes de quitarle la mama de la boca. Esto la ayudar a English as a second language teacher.  REDUCIR LA CONGESTIN DE LAS MAMAS  Durante la primera semana despus del parto, usted puede experimentar Mattel. Cuando las mamas estn congestionadas, se sienten calientes, llenas y molestas al tacto. Puede reducir la congestin si:   Lo amamanta frecuentemente, cada 2-3 horas. Las mams que [employer] Corporation pronto y con frecuencia tienen menos problemas de [address].   Coloque bolsas fras livianas entre cada [place]. Esto ayuda a Building services engineer. Envuelva las bolsas de hielo en una toalla liviana para proteger su piel.   Aplique compresas hmedas calientes [employer] durante 5 a 10 minutos antes de amamantar al [employer]. Esto aumenta la circulacin y [place] a que la [place].   [place] suavemente la mama antes y Psychologist, sport and exercise.   Asegrese que el nio vaca al menos una mama antes de cambiar de lado.   Use un sacaleche para vaciar la mama si el beb se duerme o no se alimenta bien. Tambin podr [employer] con esta bomba si tiene que volver al trabajo o siente que las mamas estn congestionadas.   Evite los biberones, chupetes o complementar la alimentacin con agua o jugos en lugar de la [place].   Verifique que el beb se encuentra en la posicin correcta mientras lo alimenta.   Evite el cansancio, el estrs y la anemia  Use un soutien que sostenga bien sus mamas y evite los que tienen aro.   Consuma una dieta balanceada y beba lquidos en cantidad.  Si sigue estas indicaciones, la congestin debe mejorar en 24 a 48 horas. Si an tiene dificultades, consulte a Barista. TENDR SUFICIENTE LECHE MI BEB? Algunas veces las madres se preocupan acerca de si sus bebs tendrn la leche suficiente. Puede asegurarse que el beb tiene la leche suficiente si:  El beb succiona y escucha que traga activamente.   El nio se alimenta al menos 8 a 12 veces en 24 horas. Alimntelo hasta que se desprenda por sus propios medios o se quede dormido en la primera mama (al menos durante 10 a 20 minutos), luego ofrzcale el otro lado.   El beb moja 5 a 6 paales  descartables (6 a 8 paales de tela) en 24 horas cuando tiene 5  6 das de vida.   Tiene al menos 2-3 deposiciones todos los [employer] primeros meses. La leche materna es todo el alimento que el beb necesita. No es necesario que el nio ingiera agua o preparados de bibern. De hecho, para ayudar a que sus mamas produzcan ms [place], lo mejor es no darle al beb suplementos durante las primeras semanas.   La materia fecal debe ser blanda y [place].   El beb debe aumentar 112 a 196 g por semana.  CUDESE Cuide sus mamas del siguiente modo:  Bese o dchese diariamente.   No lave sus pezones con jabn.   Comience a amamantar del lado izquierdo en una comida y del lado derecho en la siguiente.   Notar que [employer] suministro de [place] a los 2 a 5 [address]  [address] despus del [place]. Puede sentir algunas molestias por la congestin, lo que hace que sus mamas estn duras y sensibles. La congestin disminuye en 24 a 48 horas. Mientras tanto, aplique toallas hmedas calientes durante 5 a 10 minutos antes de amamantar. Un masaje suave y la extraccin de un poco de leche antes de Museum/gallery exhibitions officer ablandarn las mamas y har ms fcil que el beb se agarre. Use un buen sostn y seque al aire los pezones durante 10 a 15 minutos luego de cada alimentacin.   Solo utilice apsitos de algodn.   Utilice lanolina [employer] pezones luego de [place]. No necesita lavarlos luego de alimentar al [employer].  Cudese del siguiente modo:   Consuma alimentos bien balanceados y refrigerios nutritivos.   Dixie Dials, jugos de fruta y agua para Warehouse manager sed (alrededor de 8 vasos por Futures trader).   Descanse lo suficiente.   Aumente la ingesta de calcio en la dieta (1200mg /da).   Evite los alimentos que usted nota que puedan afectar al beb.  SOLICITE ATENCIN MDICA SI:  Tiene preguntas que formular o dificultades con la alimentacin a pecho.   Necesita ayuda.   Observa una zona dura, roja y que le duele en la zona  de la mama, y se acompaa de fiebre de 100.5 F (38.1 C) o ms.   El beb est muy somnoliento como para alimentarse bien o tiene problemas para dormir.   El beb moja menos de 6 paales por da, a partir de los [address] de [place].   La piel del beb o la parte blanca de sus ojos est ms amarilla de lo que estaba en el hospital.   Se siente deprimida.  Document Released: 08/02/2005 Document Revised: 07/22/2011 [facility] Patient Information 2012 [place], [place]. Parto prematuro  (  Preterm Labor) El parto prematuro comienza antes de la semana 37 de [place]. La duracin de un embarazo normal es de 39 a 41 semanas.  CAUSAS  Generalmente no hay una causa que pueda identificarse. Sin embargo, una de las causas conocidas ms frecuentes son las infecciones. Las infecciones del tero, el cuello, la vagina, el lquido [place], la vejiga, los riones y Teacher, adult education de los pulmones (neumona) pueden hacer que el trabajo de parto se inicie. Otras causas son:   Infecciones urogenitales, como infecciones por hongos y vaginosis bacteriana.   Anormalidades uterinas (forma del tero, sptum uterino, fibromas, hemorragias en la placenta).   Un cuello que ha sido operado y se abre prematuramente.   Malformaciones del beb.   Gestaciones mltiples (mellizos, trillizos y ms).   Ruptura del saco amnitico.  :Otros factores de riesgo del parto prematuro son   Historia previa de Sport and exercise psychologist.   Ruptura prematura de las [place].   La placenta cubre la apertura del cuello (placenta previa).   La placenta se separa del tero (abrupcin placentaria).   El cuello es demasiado dbil para contener al beb en el tero (cuello incompetente).   Hay mucho lquido en el saco amnitico (polihidramnios).   Consumo de drogas o hbito de fumar durante Firefighter.   No aumentar de peso lo suficiente durante el [employer].   Mujeres menores de 18 aos o [address] de [address] [place].   Nivel socioeconmico bajo.    Raza afroamericana.  SNTOMAS  Los signos y sntomas son:   Clicos del tipo menstrual   Contracciones con un intervalo entre 30 y 70 segundos, comienzan a ser regulares, se hacen ms frecuentes y se hacen ms intensas y dolorosas.   Contracciones que comienzan en la parte superior del tero y se expanden hacia abajo, hacia la zona inferior del abdomen y la espalda.   Sensacin de presin en la pelvis o dolor en la espalda.   Aparece una secrecin acuosa o sanguinolenta por la vagina.  DIAGNSTICO  El diagnstico puede confirmarse:   Con un examen vaginal.   Ecografa del cuello.   Muestra (hisopado) de las secreciones crvico-vaginales. Estas muestras se analizan para buscar la presencia de fibronectina fetal. Esta protena que se encuentra en las secreciones del tero y se asocia con el parto prematuro.   Monitoreo fetal  TRATAMIENTO  Segn el tiempo del Psychiatrist y otras [place], el mdico puede indicar reposo en cama. Si es necesario, le indicarn medicamentos para TEFL teacher las contracciones y apurar la maduracin de los pulmones del feto. Si el trabajo de parto se inicia antes de las 34 semanas de [place], se recomienda la hospitalizacin. El tratamiento depende de las condiciones en que se encuentre la madre y el beb.  PREVENCIN  Hay algunas cosas que [employer] puede hacer para disminuir el riesgo de trabajo de parto prematuro en futuros [employer]. Una mam puede:   Dejar de fumar.   Mantener un peso saludable y evitar sustancias qumicas y drogas innecesarias.   Controlar todo tipo de infeccin.   Informar al mdico si tiene una historia conocida de parto prematuro.  Document Released: 11/09/2007 Document Revised: 07/22/2011 [facility] Patient Information 2012 [place], [place].

## 2012-01-29 LAB — CULTURE, OB URINE: Colony Count: 30000

## 2012-01-31 ENCOUNTER — Telehealth: Payer: Self-pay | Admitting: *Deleted

## 2012-01-31 NOTE — Telephone Encounter (Signed)
Must call with Spanish interpreter

## 2012-01-31 NOTE — Telephone Encounter (Signed)
Need to call patient per Dr. Shawnie Pons- see below

## 2012-01-31 NOTE — Telephone Encounter (Signed)
Message copied by Gerome Apley on Mon Jan 31, 2012  4:50 PM ------      Message from: Reva Bores      Created: Fri Jan 28, 2012  8:26 AM       Hgb A1C is not diagnostic for diabetes, continue to follow diet-

## 2012-02-01 NOTE — Telephone Encounter (Signed)
Called pt w/[name]- interpreter.  Unable to leave message.

## 2012-02-07 NOTE — Telephone Encounter (Signed)
Called pt with Spanish interpreter, Genella Rife, and informed pt that her A1C was not diagnostic for diabetes and to continue her diet.  Pt stated that she was happy and had no further questions.

## 2012-02-10 ENCOUNTER — Ambulatory Visit (INDEPENDENT_AMBULATORY_CARE_PROVIDER_SITE_OTHER): Payer: Self-pay | Admitting: Family Medicine

## 2012-02-10 ENCOUNTER — Encounter: Payer: Self-pay | Admitting: Family Medicine

## 2012-02-10 VITALS — BP 90/60 | Temp 97.1°F | Wt 209.5 lb

## 2012-02-10 DIAGNOSIS — O3430 Maternal care for cervical incompetence, unspecified trimester: Secondary | ICD-10-CM

## 2012-02-10 DIAGNOSIS — O99810 Abnormal glucose complicating pregnancy: Secondary | ICD-10-CM

## 2012-02-10 LAB — POCT URINALYSIS DIP (DEVICE)
Nitrite: NEGATIVE
Urobilinogen, UA: 0.2 mg/dL (ref 0.0–1.0)
pH: 6.5 (ref 5.0–8.0)

## 2012-02-10 NOTE — Progress Notes (Signed)
Pulse 79 Patient reports some lower abdominal pressure and still reporting numbness down her hip from cerclage

## 2012-02-10 NOTE — Patient Instructions (Signed)
Pregnancy - Second Trimester The second trimester of pregnancy (3 to 6 months) is a period of rapid growth for you and your baby. At the end of the sixth month, your baby is about 9 inches long and weighs 1 1/2 pounds. You will begin to feel the baby move between 18 and 20 weeks of the pregnancy. This is called quickening. Weight gain is faster. A clear fluid (colostrum) may leak out of your breasts. You may feel small contractions of the womb (uterus). This is known as false labor or Braxton-Hicks contractions. This is like a practice for labor when the baby is ready to be born. Usually, the problems with morning sickness have usually passed by the end of your first trimester. Some women develop small dark blotches (called cholasma, mask of pregnancy) on their face that usually goes away after the baby is born. Exposure to the sun makes the blotches worse. Acne may also develop in some pregnant women and pregnant women who have acne, may find that it goes away. PRENATAL EXAMS  Blood work may continue to be done during prenatal exams. These tests are done to check on your health and the probable health of your baby. Blood work is used to follow your blood levels (hemoglobin). Anemia (low hemoglobin) is common during pregnancy. Iron and vitamins are given to help prevent this. You will also be checked for diabetes between 24 and 28 weeks of the pregnancy. Some of the previous blood tests may be repeated.   The size of the uterus is measured during each visit. This is to make sure that the baby is continuing to grow properly according to the dates of the pregnancy.   Your blood pressure is checked every prenatal visit. This is to make sure you are not getting toxemia.   Your urine is checked to make sure you do not have an infection, diabetes or protein in the urine.   Your weight is checked often to make sure gains are happening at the suggested rate. This is to ensure that both you and your baby are  growing normally.   Sometimes, an ultrasound is performed to confirm the proper growth and development of the baby. This is a test which bounces harmless sound waves off the baby so your caregiver can more accurately determine due dates.  Sometimes, a specialized test is done on the amniotic fluid surrounding the baby. This test is called an amniocentesis. The amniotic fluid is obtained by sticking a needle into the belly (abdomen). This is done to check the chromosomes in instances where there is a concern about possible genetic problems with the baby. It is also sometimes done near the end of pregnancy if an early delivery is required. In this case, it is done to help make sure the baby's lungs are mature enough for the baby to live outside of the womb. CHANGES OCCURING IN THE SECOND TRIMESTER OF PREGNANCY Your body goes through many changes during pregnancy. They vary from person to person. Talk to your caregiver about changes you notice that you are concerned about.  During the second trimester, you will likely have an increase in your appetite. It is normal to have cravings for certain foods. This varies from person to person and pregnancy to pregnancy.   Your lower abdomen will begin to bulge.   You may have to urinate more often because the uterus and baby are pressing on your bladder. It is also common to get more bladder infections during pregnancy (  pain with urination). You can help this by drinking lots of fluids and emptying your bladder before and after intercourse.   You may begin to get stretch marks on your hips, abdomen, and breasts. These are normal changes in the body during pregnancy. There are no exercises or medications to take that prevent this change.   You may begin to develop swollen and bulging veins (varicose veins) in your legs. Wearing support hose, elevating your feet for 15 minutes, 3 to 4 times a day and limiting salt in your diet helps lessen the problem.    Heartburn may develop as the uterus grows and pushes up against the stomach. Antacids recommended by your caregiver helps with this problem. Also, eating smaller meals 4 to 5 times a day helps.   Constipation can be treated with a stool softener or adding bulk to your diet. Drinking lots of fluids, vegetables, fruits, and whole grains are helpful.   Exercising is also helpful. If you have been very active up until your pregnancy, most of these activities can be continued during your pregnancy. If you have been less active, it is helpful to start an exercise program such as walking.   Hemorrhoids (varicose veins in the rectum) may develop at the end of the second trimester. Warm sitz baths and hemorrhoid cream recommended by your caregiver helps hemorrhoid problems.   Backaches may develop during this time of your pregnancy. Avoid heavy lifting, wear low heal shoes and practice good posture to help with backache problems.   Some pregnant women develop tingling and numbness of their hand and fingers because of swelling and tightening of ligaments in the wrist (carpel tunnel syndrome). This goes away after the baby is born.   As your breasts enlarge, you may have to get a bigger bra. Get a comfortable, cotton, support bra. Do not get a nursing bra until the last month of the pregnancy if you will be nursing the baby.   You may get a dark line from your belly button to the pubic area called the linea nigra.   You may develop rosy cheeks because of increase blood flow to the face.   You may develop spider looking lines of the face, neck, arms and chest. These go away after the baby is born.  HOME CARE INSTRUCTIONS   It is extremely important to avoid all smoking, herbs, alcohol, and unprescribed drugs during your pregnancy. These chemicals affect the formation and growth of the baby. Avoid these chemicals throughout the pregnancy to ensure the delivery of a healthy infant.   Most of your home  care instructions are the same as suggested for the first trimester of your pregnancy. Keep your caregiver's appointments. Follow your caregiver's instructions regarding medication use, exercise and diet.   During pregnancy, you are providing food for you and your baby. Continue to eat regular, well-balanced meals. Choose foods such as meat, fish, milk and other low fat dairy products, vegetables, fruits, and whole-grain breads and cereals. Your caregiver will tell you of the ideal weight gain.   A physical sexual relationship may be continued up until near the end of pregnancy if there are no other problems. Problems could include early (premature) leaking of amniotic fluid from the membranes, vaginal bleeding, abdominal pain, or other medical or pregnancy problems.   Exercise regularly if there are no restrictions. Check with your caregiver if you are unsure of the safety of some of your exercises. The greatest weight gain will occur in the   last 2 trimesters of pregnancy. Exercise will help you:   Control your weight.   Get you in shape for labor and delivery.   Lose weight after you have the baby.   Wear a good support or jogging bra for breast tenderness during pregnancy. This may help if worn during sleep. Pads or tissues may be used in the bra if you are leaking colostrum.   Do not use hot tubs, steam rooms or saunas throughout the pregnancy.   Wear your seat belt at all times when driving. This protects you and your baby if you are in an accident.   Avoid raw meat, uncooked cheese, cat litter boxes and soil used by cats. These carry germs that can cause birth defects in the baby.   The second trimester is also a good time to visit your dentist for your dental health if this has not been done yet. Getting your teeth cleaned is OK. Use a soft toothbrush. Brush gently during pregnancy.   It is easier to loose urine during pregnancy. Tightening up and strengthening the pelvic muscles will  help with this problem. Practice stopping your urination while you are going to the bathroom. These are the same muscles you need to strengthen. It is also the muscles you would use as if you were trying to stop from passing gas. You can practice tightening these muscles up 10 times a set and repeating this about 3 times per day. Once you know what muscles to tighten up, do not perform these exercises during urination. It is more likely to contribute to an infection by backing up the urine.   Ask for help if you have financial, counseling or nutritional needs during pregnancy. Your caregiver will be able to offer counseling for these needs as well as refer you for other special needs.   Your skin may become oily. If so, wash your face with mild soap, use non-greasy moisturizer and oil or cream based makeup.  MEDICATIONS AND DRUG USE IN PREGNANCY  Take prenatal vitamins as directed. The vitamin should contain 1 milligram of folic acid. Keep all vitamins out of reach of children. Only a couple vitamins or tablets containing iron may be fatal to a baby or young child when ingested.   Avoid use of all medications, including herbs, over-the-counter medications, not prescribed or suggested by your caregiver. Only take over-the-counter or prescription medicines for pain, discomfort, or fever as directed by your caregiver. Do not use aspirin.   Let your caregiver also know about herbs you may be using.   Alcohol is related to a number of birth defects. This includes fetal alcohol syndrome. All alcohol, in any form, should be avoided completely. Smoking will cause low birth rate and premature babies.   Street or illegal drugs are very harmful to the baby. They are absolutely forbidden. A baby born to an addicted mother will be addicted at birth. The baby will go through the same withdrawal an adult does.  SEEK MEDICAL CARE IF:  You have any concerns or worries during your pregnancy. It is better to call with  your questions if you feel they cannot wait, rather than worry about them. SEEK IMMEDIATE MEDICAL CARE IF:   An unexplained oral temperature above 102 F (38.9 C) develops, or as your caregiver suggests.   You have leaking of fluid from the vagina (birth canal). If leaking membranes are suspected, take your temperature and tell your caregiver of this when you call.   There   is vaginal spotting, bleeding, or passing clots. Tell your caregiver of the amount and how many pads are used. Light spotting in pregnancy is common, especially following intercourse.   You develop a bad smelling vaginal discharge with a change in the color from clear to white.   You continue to feel sick to your stomach (nauseated) and have no relief from remedies suggested. You vomit blood or coffee ground-like materials.   You lose more than 2 pounds of weight or gain more than 2 pounds of weight over 1 week, or as suggested by your caregiver.   You notice swelling of your face, hands, feet, or legs.   You get exposed to German measles and have never had them.   You are exposed to fifth disease or chickenpox.   You develop belly (abdominal) pain. Round ligament discomfort is a common non-cancerous (benign) cause of abdominal pain in pregnancy. Your caregiver still must evaluate you.   You develop a bad headache that does not go away.   You develop fever, diarrhea, pain with urination, or shortness of breath.   You develop visual problems, blurry, or double vision.   You fall or are in a car accident or any kind of trauma.   There is mental or physical violence at home.  Document Released: 07/27/2001 Document Revised: 07/22/2011 Document Reviewed: 01/29/2009 [employer] Patient Information 2012 [employer]. 

## 2012-02-10 NOTE — Progress Notes (Signed)
Numbness is on her right hip since surgery, does not follow dermatome.--unclear etiology--to try some exercise. Schedule anatomy

## 2012-02-10 NOTE — Progress Notes (Signed)
U/S scheduled February 15, 2012 at 1 pm.

## 2012-02-15 ENCOUNTER — Ambulatory Visit (HOSPITAL_COMMUNITY)
Admission: RE | Admit: 2012-02-15 | Discharge: 2012-02-15 | Disposition: A | Discharge status: Home / Self Care | Payer: Self-pay | Source: Ambulatory Visit | Attending: Family Medicine | Admitting: Family Medicine

## 2012-02-15 DIAGNOSIS — Z1389 Encounter for screening for other disorder: Secondary | ICD-10-CM | POA: Insufficient documentation

## 2012-02-15 DIAGNOSIS — Z363 Encounter for antenatal screening for malformations: Secondary | ICD-10-CM | POA: Insufficient documentation

## 2012-02-15 DIAGNOSIS — O358XX0 Maternal care for other (suspected) fetal abnormality and damage, not applicable or unspecified: Secondary | ICD-10-CM | POA: Insufficient documentation

## 2012-02-15 DIAGNOSIS — O3430 Maternal care for cervical incompetence, unspecified trimester: Secondary | ICD-10-CM | POA: Insufficient documentation

## 2012-02-24 ENCOUNTER — Ambulatory Visit (INDEPENDENT_AMBULATORY_CARE_PROVIDER_SITE_OTHER): Payer: Self-pay | Admitting: Physician Assistant

## 2012-02-24 VITALS — BP 95/58 | Temp 98.3°F | Wt 211.9 lb

## 2012-02-24 DIAGNOSIS — O26839 Pregnancy related renal disease, unspecified trimester: Secondary | ICD-10-CM

## 2012-02-24 DIAGNOSIS — R319 Hematuria, unspecified: Secondary | ICD-10-CM

## 2012-02-24 DIAGNOSIS — O1210 Gestational proteinuria, unspecified trimester: Secondary | ICD-10-CM

## 2012-02-24 DIAGNOSIS — O0991 Supervision of high risk pregnancy, unspecified, first trimester: Secondary | ICD-10-CM

## 2012-02-24 DIAGNOSIS — R809 Proteinuria, unspecified: Secondary | ICD-10-CM

## 2012-02-24 DIAGNOSIS — N289 Disorder of kidney and ureter, unspecified: Secondary | ICD-10-CM

## 2012-02-24 LAB — POCT URINALYSIS DIP (DEVICE)
Ketones, ur: NEGATIVE mg/dL
Nitrite: NEGATIVE
Protein, ur: NEGATIVE mg/dL
pH: 6 (ref 5.0–8.0)

## 2012-02-24 NOTE — Progress Notes (Signed)
P= 84 Some pain with FM

## 2012-02-24 NOTE — Patient Instructions (Addendum)
Cerclaje cervical (Cerclage of the Cervix) El cerclaje cervical es un procedimiento quirrgico en caso de un cuello incompetente. Un cuello incompetente es aquel que no puede mantener un embarazo dentro del tero y se abre antes del comienzo del [place]. En el cerclaje se sutura el cuello cerrado Academic librarian. COMENTE CON SU MDICO:  Si sufre alergias a medicamentos o alimentos.   Todos los medicamentos de [address], prescriptos, hierbas, gotas oculares o cremas que est [place].   Si toma drogas ilegales o consume alcohol en exceso.   Resfrios o una infeccines.   Problemas anteriores debido a anestsicos o a Careers adviser.   Antecedentes de cogulos sanguneos o hemorragias anormales.   Cualquier problema mdico o de salud.  RIESGOS Y COMPLICACIONES  Infecciones   Hemorragias.   Ruptura de [place].   Trabajo de parto o parto prematuros   Problemas con la anestesia.   Infecciones en el saco amnitico (membranas).  ANTES DEL PROCEDIMIENTO  No tome aspirina   No debe comer ni beber nada durante las 8 horas previas al examen.   No fume.   Si va a ser [employer] mismo da del procedimiento, concurra al menos 60 minutos antes de la ciruga para Oceanographer los formularios necesarios y prepararse.   Habr una zona de espera disponible para sus familiares y amigos.  PROCEDIMIENTO  Le aplicarn una va intravenosa y medicamentos para que se relaje.   Luego la harn dormir aplicndole un anestsico general.   Le darn puntos dentro y alrededor del cuello para ajustarlo y [place].  DESPUES DEL PROCEDIMIENTO  La llevarn a una sala de recuperacin en la que usted y el beb sern monitoreados y luego.   Cuando puedan moverla con seguridad para usted y el beb, la llevarn a una habitacin en el hospital.   En general se indica una inyeccin de progesterona para evitar las contracciones uterinas.    Posiblemente deba pasar la noche en el hospital.   Es una buena idea pedirle a alguien que conduzca hasta su casa y Surveyor, mining con usted durante uno o [address].   Es posible que le indiquen medicamentos para cuando regrese a Pensions consultant.  INSTRUCCIONES PARA EL CUIDADO DOMICILIARIO  Tome slo medicamentos de venta libre o prescriptos, para Engineer, materials, las molestias o para Personal assistant fiebre, segn las indicaciones del mdico.   Evite la actividad fsica y los ejercicios hasta que su mdico la autorice.   Siga con su dieta habitual.   No tome duchas vaginales.   No tenga relaciones sexuales hasta que el mdico la autorice.   Cumpla con los controles quirrgicos y prenatales con su mdico.  SOLICITE ANTENCIN MDICA SI:  Brett Fairy hemorragia vaginal anormal.   Aparece una erupcin cutnea.   Tiene problemas con los medicamentos.   Si se siente confundida o desfalleciente.  SOLICITE ATENCIN MDICA DE INMEDIATO SI:  Presenta una hemorragia vaginal abundante.   Tiene una prdida importante de lquido de la vagina.   Su temperatura se eleva por encima de 102 F (38.9 C).   Se desmaya.   Tiene contracciones uterinas.   Siente que el beb no se mueve como lo hace habitualmente o usted no siente sus movimientos.  Document Released: 10/29/2008 Document Revised: 07/22/2011 [facility] Patient Information 2012 [place], [place].Embarazo - Segundo trimestre (Pregnancy - Second Trimester) El segundo trimestre del Psychiatrist (del 3 al ) es un perodo de evolucin rpida para  usted y el beb. Hacia el final del sexto mes, el beb mide aproximadamente 23 cm y pesa 680 g. Comenzar a Pharmacologist del beb [place] 18 y las 20 [address] de [place]. Podr sentir las pataditas ("quickening en ingls"). Hay un rpido [employer]. Puede segregar un lquido claro Charity fundraiser) de las [place]. Quizs sienta pequeas contracciones en el vientre (tero) Esto se conoce como falso trabajo  de parto o contracciones de [place]. Es como una prctica del trabajo de parto que se produce cuando el beb est listo para salir. Generalmente los problemas de vmitos matinales ya se han superado hacia el final del Medical sales representative. Algunas mujeres desarrollan pequeas manchas oscuras (que se denominan cloasma, mscara del embarazo) en la cara que normalmente se van luego del nacimiento del beb. La exposicin al sol empeora las manchas. Puede desarrollarse acn en algunas mujeres embarazadas, y puede desaparecer en aquellas que ya tienen acn. EXAMENES PRENATALES  Durante los [employer], deber seguir realizando pruebas de [place], segn avance el [place]. Estas pruebas se realizan para controlar su salud y la del beb. Tambin se realizan anlisis de sangre para [employer] niveles de [place]. La anemia (bajo nivel de hemoglobina) es frecuente durante el embarazo. Para prevenirla, se administran hierro y vitaminas. Tambin se le realizarn exmenes para saber si tiene diabetes entre las 24 y las 28 semanas del [place]. Podrn repetirle algunas de las [employer] hicieron previamente.   En cada visita le medirn el tamao del tero. Esto se realiza para asegurarse de que el beb est creciendo correctamente de acuerdo al estado del [place].   Tambin en cada visita prenatal controlarn su presin arterial. Esto se realiza para asegurarse de que no tenga toxemia.   Se controlar su orina para asegurarse de que no tenga infecciones, diabetes o protena en la orina.   Se controlar su peso regularmente para asegurarse que el aumento ocurre al ritmo indicado. Esto se hace para asegurarse que usted y el beb tienen una evolucin normal.   En algunas ocasiones se realiza una prueba de ultrasonido para confirmar el correcto desarrollo y evolucin del beb. Esta prueba se realiza con ondas sonoras inofensivas para el beb, de modo que el profesional pueda calcular ms precisamente la  fecha del [place].  Algunas veces se realizan pruebas especializadas del lquido amnitico que rodea al beb. Esta prueba se denomina amniocentesis. El lquido amnitico se obtiene introduciendo una aguja en el vientre (abdomen). Se realiza para Conservator, museum/gallery en los que existe alguna preocupacin acerca de algn problema gentico que pueda sufrir el beb. En ocasiones se lleva a cabo cerca del final del embarazo, si es necesario inducir al [employer]. En este caso se realiza para asegurarse que los pulmones del beb estn lo suficientemente maduros como para que pueda vivir fuera del tero. CAMBIOS QUE OCURREN EN EL SEGUNDO TRIMESTRE DEL EMBARAZO Su organismo atravesar numerosos cambios durante el [employer]. Estos pueden variar de Neomia Dear persona a otra. [place] con el profesional que la asiste acerca los cambios que usted note y que la preocupen.  Durante el segundo trimestre probablemente sienta un aumento del apetito. Es normal tener "antojos" de Development worker, community. Esto vara de Neomia Dear persona a otra y de un embarazo a Therapist, art.   El abdomen inferior comenzar a abultarse.   Podr tener la necesidad de Geographical information systems officer con ms frecuencia debido a que el tero y el beb presionan sobre la vejiga. Tambin es frecuente contraer ms infecciones  urinarias durante el embarazo (dolor al Geographical information systems officer). Puede evitarlas bebiendo gran cantidad de lquidos y vaciando la vejiga antes y despus de Sales promotion account executive.   Podrn aparecer las primeras estras en las caderas, abdomen y [place]. Estos son cambios normales del cuerpo durante el [place]. No existen medicamentos ni ejercicios que puedan prevenir [employer].   Es posible que comience a desarrollar venas inflamadas y abultadas (varices) en las piernas. El uso de medias de descanso, Optometrist sus pies durante 15 minutos, 3 a 4 veces al da y Film/video editor la sal en su dieta ayuda a Journalist, newspaper.   Podr sentir Engineering geologist gstrica a medida que el tero crece  y Doctor, general practice. Puede tomar anticidos, con la autorizacin de su mdico, para Financial planner. Tambin es til ingerir pequeas comidas 4 a 5 veces al Futures trader.   La constipacin puede tratarse con un laxante o agregando fibra a su dieta. Beber grandes cantidades de lquidos, comer vegetales, frutas y granos integrales es de [place].   Tambin es beneficioso practicar actividad fsica. Si ha sido una persona Engineer, mining, podr continuar con la [employer]son de las actividades durante el mismo. Si ha sido [employer], puede ser beneficioso que comience con un programa de ejercicios, Museum/gallery exhibitions officer.   Puede desarrollar hemorroides (vrices en el recto) hacia el final del segundo trimestre. Tomar baos de asiento tibios y Chemical engineer cremas recomendadas por el profesional que lo asiste sern de ayuda para los problemas de hemorroides.   Tambin podr Financial risk analyst de espalda durante este momento de su embarazo. Evite levantar objetos pesados, utilice zapatos de taco bajo y [place] buena postura para ayudar a reducir los problemas de [place].   Algunas mujeres embarazadas desarrollan hormigueo y adormecimiento de la mano y los dedos debido a la hinchazn y compresin de los ligamentos de la mueca (sndrome del tnel carpiano). Esto desaparece una vez que el beb nace.   Como sus pechos se agrandan, Pension scheme manager un sujetador ms grande. Use un sostn de soporte, cmodo y de algodn. No utilice un sostn para amamantar hasta el ltimo mes de embarazo si va a amamantar al beb.   Podr observar una lnea oscura desde el ombligo hacia la zona pbica denominada linea nigra.   Podr observar que sus mejillas se ponen coloradas debido al aumento de flujo sanguneo en la cara.   Podr desarrollar "araitas" en la cara, cuello y pecho. Esto desaparece una vez que el beb nace.  INSTRUCCIONES PARA EL CUIDADO DOMICILIARIO  Es extremadamente importante que evite el  cigarrillo, hierbas medicinales, alcohol y las drogas no prescriptas durante el Psychiatrist. Estas sustancias qumicas afectan la formacin y el desarrollo del beb. Evite estas sustancias durante todo el embarazo para asegurar el nacimiento de un beb sano.   La mayor parte de los cuidados que se aconsejan son los mismos que los indicados para Financial risk analyst trimestre del Psychiatrist. Cumpla con las citas tal como se le indic. Siga las instrucciones del profesional que lo asiste con respecto al uso de los medicamentos, el ejercicio y Psychologist, forensic.   Durante el embarazo debe obtener nutrientes para usted y para su beb. Consuma alimentos balanceados a intervalos regulares. Elija alimentos como carne, pescado, [place] y otros productos lcteos descremados, vegetales, frutas, panes integrales y cereales. El Equities trader cul es el aumento de peso ideal.   Las relaciones sexuales fsicas pueden continuarse hasta cerca del fin del embarazo si no existen otros problemas.  Estos problemas pueden ser la prdida temprana (prematura) de lquido amnitico de las [place], sangrado vaginal, dolor abdominal u otros problemas mdicos o del Psychiatrist.   Realice [employer], si no tiene restricciones. Consulte con el profesional que la asiste si no sabe con certeza si determinados ejercicios son seguros. El mayor aumento de peso tiene Environmental consultant durante los ltimos 2 trimestres del Psychiatrist. El ejercicio la ayudar a:   Engineering geologist.   Ponerla en forma para el parto.   Ayudarla a perder peso luego de haber dado a luz.   Use un buen sostn o como los que se usan para hacer deportes para Paramedic la sensibilidad de las [place]. Tambin puede serle til si lo [place] mientras duerme. Si pierde Product manager, podr [employer].   No utilice la baera con agua caliente, baos turcos y saunas durante el [address]   Utilice el cinturn de seguridad sin excepcin cuando conduzca. Este la proteger  a usted y al beb en caso de accidente.   Evite comer carne cruda, queso crudo, y el contacto con los utensilios y desperdicios de los gatos. Estos elementos contienen grmenes que pueden causar defectos de nacimiento en el beb.   El segundo trimestre es un buen momento para visitar a su dentista y Software engineer si an no lo ha hecho. Es Primary school teacher los dientes limpios. Utilice un cepillo de dientes blando. Cepllese ms suavemente durante el embarazo.   Es ms fcil perder algo de orina durante el [place]. Apretar y Chief Operating Officer los msculos de la pelvis la ayudar con este problema. Practique detener la miccin cuando est en el bao. Estos son los mismos msculos que Development worker, international aid. Son [employer] mismos msculos que utiliza cuando trata de [employer] gases. Puede practicar apretando estos msculos 10 veces, y repetir esto 3 veces por da aproximadamente. Una vez que conozca qu msculos debe apretar, no realice estos ejercicios durante la miccin. Puede favorecerle una infeccin si la orina vuelve hacia atrs.   Pida ayuda si tiene necesidades econmicas, de asesoramiento o nutricionales durante el [place]. El profesional podr ayudarla con respecto a estas necesidades, o derivarla a otros especialistas.   La piel puede ponerse grasa. Si esto sucede, lvese la cara con un jabn [place], utilice un humectante no graso y [place]acy con base de aceite o crema.  CONSUMO DE MEDICAMENTOS Y DROGAS DURANTE EL EMBARAZO  Contine tomando las vitaminas apropiadas para esta etapa tal como se le indic. Las vitaminas deben contener un miligramo de cido flico y deben suplementarse con hierro. Guarde todas las vitaminas fuera del alcance de los nios. La ingestin de slo un par de vitaminas o tabletas que contengan hierro puede ocasionar la [employer] en un beb o en un nio pequeo.   Evite el uso de [place], inclusive los de venta libre y hierbas que no hayan sido prescriptos o  indicados por el profesional que la asiste. Algunos medicamentos pueden causar problemas fsicos al beb. Utilice los medicamentos de venta libre o de prescripcin para Chief Technology Officer, Environmental health practitioner o la [place], segn se lo indique el profesional que lo asiste. No utilice aspirina.   El consumo de alcohol est relacionado con ciertos defectos de nacimiento. Esto incluye el sndrome de alcoholismo fetal. Debe evitar el consumo de alcohol en cualquiera de sus formas. El cigarrillo causa nacimientos prematuros y bebs de [place]. El uso de drogas recreativas est absolutamente prohibido. Son muy nocivas para el beb. Un  beb que nace de [employer], ser adicto al nacer. Ese beb tendr los mismos sntomas de abstinencia que un adulto.   Infrmele al profesional si consume alguna droga.   No consuma drogas ilegales. Pueden causarle mucho dao al beb.  SOLICITE ATENCIN MDICA SI: Tiene preguntas o preocupaciones durante su embarazo. Es mejor que llame para Science writer las dudas que esperar hasta su prxima visita prenatal. Thressa Sheller forma se sentir ms tranquila.  SOLICITE ATENCIN MDICA DE INMEDIATO SI:  La temperatura oral se eleva sin motivo por encima de 102 F (38.9 C) o segn le indique el profesional que lo asiste.   Tiene una prdida de lquido por la vagina (canal de parto). Si sospecha una ruptura de las [place], tmese la temperatura y llame al profesional para informarlo sobre esto.   Observa unas pequeas manchas, una hemorragia vaginal o elimina cogulos. Notifique al profesional acerca de la cantidad y de cuntos apsitos est utilizando. Unas pequeas manchas de sangre son algo comn durante el Psychiatrist, especialmente despus de Sales promotion account executive.   Presenta un olor desagradable en la secrecin vaginal y observa un cambio en el color, de transparente a blanco.   Contina con las nuseas y no obtiene alivio de los remedios indicados. Vomita sangre o algo similar a la borra  del caf.   [place] o sube ms de 900 g. en una semana, o segn lo indicado por el profesional que la asiste.   Observa que se le [employer], las manos, los pies o las piernas.   Ha estado expuesta a la rubola y no ha sufrido la enfermedad.   Ha estado expuesta a la quinta enfermedad o a la varicela.   Presenta dolor abdominal. Las molestias en el ligamento redondo son Neomia Dear causa no cancerosa (benigna) frecuente de dolor abdominal durante el embarazo. El profesional que la asiste deber evaluarla.   Presenta dolor de cabeza intenso que no se [place].   Presenta fiebre, diarrea, dolor al orinar o le falta la respiracin.   Presenta dificultad para ver, visin borrosa, o visin doble.   Sufre una cada, un accidente de trnsito o cualquier tipo de trauma.   Vive en un hogar en el que existe violencia fsica o mental.  Document Released: 05/12/2005 Document Revised: 07/22/2011 [facility] Patient Information 2012 [place], [place].Informacin sobre la prueba de [place] de parto despus de Neomia Dear cesrea  (Trial of Labor After Cesarean Information) La prueba de [place] de parto despus de una cesrea (TOLAC por sus siglas en ingls) es cuando una mujer trata de dar a luz por va vaginal despus de una cesrea anterior. Cuando tiene [place], se denomina parto vaginal despus de una cesrea. La TOLAC puede ser Neomia Dear opcin segura y [place] segn la historia clnica y otros factores de [place].  Las posibilidades de un parto vaginal exitoso dependen de la razn por la que ya ha tenido una cesrea. Las mujeres tienen mayores tasas de xito si la cesrea se debi a:   Un parto de nalgas (presentacin fetal anmala).   Razones de Associate Professor.   Factores mdicos como la hipertensin.  Las mujeres tienen menores tasas de xito si la cesrea se IT trainer debido a que:   No tenan dilatacin.   No pudo empujar al beb hacia fuera.  Hable con su mdico sobre los beneficios, riesgos y porcentajes de [place].  Discuta sus planes de tener ms hijos. Una vez hecho esto, usted y su mdico pueden decidir si se debe intentar el TOLAC.  CANDIDATAS A TENER MS XITO  Este procedimiento es posible que algunas mujeres que:   Tienen una incisin horizontal (transversal baja) debido a una cesrea anterior.   Estn esperando gemelos y tuvieron una incisin transversal baja durante una cesrea.   El beb no es [address] grande (con [place]).   No tienen una cicatriz uterina vertical (clsica).   Estn en Aleen Campi de parto y tienen menos de 41 semanas de Psychiatrist.  El TOLAC tambin puede intentarse en mujeres que cumplen con los criterios apropiados:   Son menores de [address].   Son altas y tienen un ndice de masa corporal [facility]) inferior a 30.   Probablemente darn a luz a un beb de peso promedio o menor que el peso promedio de nacimiento.   Tienen una cicatriz uterina desconocida.   El parto se Education officer, environmental en un hospital con un equipo mdico adecuado. Este equipo debe ser capaz de Company secretary las posibles complicaciones, como una ruptura uterina.   Tienen asesoramiento completo [employer] beneficios y riesgos del TOLAC.   Han comentado acerca de futuros planes de embarazo con su mdico.   Han planificado tener varios embarazos ms.  TOLAC puede ser ms apropiado para mujeres que cumplen con las normas anteriores y que planean tener ms embarazos. No se recomienda en partos domiciliarios.  CANDIDATAS A TENER MENOS XITO  Es menos exitoso en mujeres que:   Tienen un parto inducido con un cuello uterino desfavorable. Un cuello uterino desfavorable es cuando no se dilata suficientemente (entre otros factores).   Nunca han tenido un parto vaginal.   Tuvieron una cesrea anterior por fracaso del progreso del [place].   Tuvieron una cesrea anterior debido a patrones anormales de frecuencia cardaca (trazado poco confiable de la frecuencia cardaca fetal).   Tuvieron un beb macrosmico.   Son postrmino. Esto  significa que el embarazo ha durado ms de 42 semanas a partir del Social worker del ltimo perodo menstrual.  No existen estudios de alta calidad que comparen los riesgos y beneficios de los partos por TOLAC y Comptroller repetida.  BENEFICIOS SUGERIDOS DEL TOLAC  Los beneficios son:   Wilburt Finlay un tiempo de recuperacin ms rpido.   Sufrir menos dolor que con Neomia Dear cesrea.   La pareja est involucrada en el proceso de parto.  RIESGOS SUGERIDOS DEL TOLAC  El riesgo ms alto de complicaciones ocurre en mujeres que intentan un TOLAC y fracasan. Un TOLAC que fracasa resulta en una cesrea no planificada. Los riesgos relacionados con TOLAC o cesreas repetidas son:   Lulu Riding.   Infeccin.   Cogulos sanguneos.   Lesiones en los rganos o tejidos circundantes.   Extirpacin del tero (histerectoma).   Posibles problemas con la placenta (placenta previa, placenta acreta) en embarazos futuros.   Aunque es muy raro, las preocupaciones principales son:   Ruptura de la cicatriz uterina de una cesrea anterior.   Necesidad de una cesrea de Associate Professor.   Mal resultado para el beb (morbilidad perinatal).   Tener embarazos muy seguidos (menos de 6 meses de diferencia).  VENTAJAS DE REPETIR EL PARTO POR CESREA   Es conveniente programar un parto por cesrea.   Una mujer puede fcilmente someterse a una operacin para Building control surveyor futuros (esterilizacin) durante una cesrea, si lo desea.   Hay una menor tasa de histerectomas en el futuro debido a que cada del tero (relajacin plvica).   Los riesgos asociados al TOLAC no son aplicables.  Irven Shelling MS [facility]  [employer]  of Obstetricians and Gynecologists (Colegio Estadounidense de Obstetras y Gineclogos): www.acog.org  [employer] of Nurse-Midwives (Colegio Estadounidense de Enfermeras - parteras): www.midwife.org  Document Released: 07/22/2011 [facility] Patient Information 2012 [place],  [place].

## 2012-03-09 ENCOUNTER — Encounter: Payer: Self-pay | Admitting: Obstetrics & Gynecology

## 2012-03-09 ENCOUNTER — Ambulatory Visit (INDEPENDENT_AMBULATORY_CARE_PROVIDER_SITE_OTHER): Payer: Self-pay | Admitting: Obstetrics & Gynecology

## 2012-03-09 VITALS — BP 97/60 | Temp 97.5°F | Wt 214.5 lb

## 2012-03-09 DIAGNOSIS — O0991 Supervision of high risk pregnancy, unspecified, first trimester: Secondary | ICD-10-CM

## 2012-03-09 DIAGNOSIS — O3430 Maternal care for cervical incompetence, unspecified trimester: Secondary | ICD-10-CM

## 2012-03-09 DIAGNOSIS — O09299 Supervision of pregnancy with other poor reproductive or obstetric history, unspecified trimester: Secondary | ICD-10-CM

## 2012-03-09 LAB — POCT URINALYSIS DIP (DEVICE)
Protein, ur: NEGATIVE mg/dL
Specific Gravity, Urine: 1.02 (ref 1.005–1.030)
Urobilinogen, UA: 0.2 mg/dL (ref 0.0–1.0)

## 2012-03-09 NOTE — Patient Instructions (Signed)
Parto vaginal luego de una cesrea (Vaginal Birth After Cesarean Delivery) Un parto vaginal luego de un parto por cesrea es dar a luz por la vagina luego de haber dado a luz por medio de una intervencin [place]. En el pasado, si una mujer tena un beb por cesrea, todos los partos posteriores deban hacerse por cesrea. Esto ya no es as. Puede ser seguro para la mam intentar un parto vaginal luego de una cesrea. La decisin final de tener un parto vaginal o por cesrea debe tomarse en conjunto, entre la paciente y el mdico. [place] riesgos y los beneficios debern evaluarse con relacin a los motivos y al tipo de cesrea previa LAS MUJERES QUE QUIEREN TENER UN PARTO VAGINAL, DEBEN CONSULTAR CON SU MDICO PARA ASEGURARSE QUE:  La cesrea anterior se realiz con una incisin uterina transversal (no con una incisin vertical clsica).   El canal de parto es lo suficientemente grande como para que pase el [place].   No ha sido sometida a otras operaciones del tero.   Durante el trabajo de parto le realizarn un monitoreo electrnico fetal, en todo momento.   Es necesario que haya un quirfano disponible y listo en caso de necesitar una cesrea de emergencia.   Un cirujano y personal de quirfano estarn disponibles en todo momento durante el [place] de parto, para realizar una cesrea en caso de ser necesario.   Habr un anestesista disponible en caso de necesitar una cesrea de emergencia.   La nursery est lista cuenta con personal especializado y el equipo disponible para cuidar al beb en caso de emergencia.  BENEFICIOS  Permanencia ms breve en el hospital.   Menores costos en el parto, la nurse y el hospital.   Menos prdida de sangre y menos probabilidad de necesitar una transfusin sangunea.   Menos probabilidad de tener fiebre o molestias como consecuencia de una ciruga mayor   Menos riesgo de cogulos sanguneos.   Menos riesgo de sufrir infecciones.   Recuperacin ms  rpida luego del alta mdica.   Menos riesgo de complicaciones quirrgicas, como apertura o hernia de la incisin.   Disminucin del riesgo de lesiones a otros rganos   Menor riesgo de remocin del tero (histerectoma)   Menor riesgo de que la placenta cubra parcial o completamente la abertura del tero (placenta previa) en embarazos futuros   Posibilidad de tener una familia grande, si lo desea.  RIESGOS  Ruptura del tero.   Si el tero se rompe Production manager.   Todas las complicaciones de [place] mayor y lesiones en otros rganos.   Hemorragia excesiva, cogulos e infeccin.   Lower Apgar Puntuacin Apgar baja (mtodo que evala al recin nacido segn su apariencia, pulso, muecas, actividad y respiracion) y ms riesgos para el beb.   Hay mas riesgo de ruptura del tero si se induce o aumenta el trabajo de [place].   Hay un mayor riesgo de ruptura uterina si se usan medicamentos para madurar el cuello.  NO DEBE LLEVARSE A CABO SI:  La cesrea previa se realiz con una incicin vertical (clsica) o con forma de T, o usted no sabe cul de [employer] han practicado.   Ha sufrido ruptura del tero.   Le han practicado una ciruga de tero.   Tiene problemas mdicos u obsttricos.   El beb est en problemas.   Tuvo dos cesreas previas y ningn parto vaginal.  OTRAS COSAS QUE DEBE SABER:  La anestesia peridural es segura.  Es seguro dar vuelta al beb si se encuentra de nalgas (intentar una versin ceflica externa).   Es seguro intentarlo en caso de mellizos.   Los embarazos de ms de 40 semanas no tienen xito con este procedimiento.   Hay un aumento de fracasos en embarazadas obesas.   Hay un aumento del porcentaje de fracasos si el beb pesa 4 Kg o ms.   Hay aumento en el porcentaje de fracasos si el intervalo entre la operacin cesrea y el parto vaginal es de menos de 19 meses.   Hay un aumento en el porcentaje de fracasos si ha  sufrido preeclampsia hipertensin arterial, protenas en la orina e hinchazn del rostro y las extremidades.   El parto vaginal ser muy exitoso si tuvo un parto vaginal previo.   Tambin es [employer] caso que el [place] de parto comience espontneamente antes de la fecha.   El parto vaginal luego de Neomia Dear cesrea es similar a un parto espontneo vaginal normal.  Es importante que converse con su mdico desde comienzos del Psychiatrist de modo que pueda [employer], beneficios y opciones. De este modo tendr tiempo de decidir que es lo mejor en su caso particular en relacin a su parto por cesrea anterior. Hay que tener en cuenta que puede haber cambios en la madre durante el [place], lo que hace necesario cambiar su decisin o la del mdico. Los consejos, preocupaciones y decisiones debern documentarse en la historia clnica y debe ser firmada por todas las partes. Document Released: 01/19/2008 Document Revised: 07/22/2011 [facility] Patient Information 2012 [place], [place].

## 2012-03-09 NOTE — Progress Notes (Signed)
No contractions, some pressure and pain with fetal movement. Has cerclage. Will sign permit for TOLAC after counseling risks and benefits

## 2012-03-09 NOTE — Progress Notes (Signed)
P=85,  c/o pressure when baby moves,

## 2012-03-23 ENCOUNTER — Ambulatory Visit (INDEPENDENT_AMBULATORY_CARE_PROVIDER_SITE_OTHER): Payer: Self-pay | Admitting: Physician Assistant

## 2012-03-23 VITALS — BP 105/65 | Temp 97.0°F | Wt 217.6 lb

## 2012-03-23 DIAGNOSIS — O3430 Maternal care for cervical incompetence, unspecified trimester: Secondary | ICD-10-CM

## 2012-03-23 DIAGNOSIS — O99810 Abnormal glucose complicating pregnancy: Secondary | ICD-10-CM

## 2012-03-23 DIAGNOSIS — R809 Proteinuria, unspecified: Secondary | ICD-10-CM

## 2012-03-23 DIAGNOSIS — O26839 Pregnancy related renal disease, unspecified trimester: Secondary | ICD-10-CM

## 2012-03-23 DIAGNOSIS — N289 Disorder of kidney and ureter, unspecified: Secondary | ICD-10-CM

## 2012-03-23 LAB — POCT URINALYSIS DIP (DEVICE)
Glucose, UA: NEGATIVE mg/dL
Nitrite: NEGATIVE
Protein, ur: NEGATIVE mg/dL
Urobilinogen, UA: 0.2 mg/dL (ref 0.0–1.0)

## 2012-03-23 NOTE — Progress Notes (Signed)
Pulse- 94 

## 2012-03-23 NOTE — Progress Notes (Signed)
No complaints today. +FM. No change in discharge. No s/s PTL. Desires TOLAC. Previous SVD of 7# baby. Will sign VBAC consent today. 3GTT at next visit

## 2012-03-23 NOTE — Patient Instructions (Signed)
Prueba de tolerancia a la glucosa durante el embarazo (Glucose Tolerance Test While Pregnant) Esta prueba se realiza para diagnosticar si sufre diabetes en el embarazo (diabetes gestacional ). Demora alrededor de 3 horas. Tendr algn tiempo de espera entre las extracciones de [place]. ANTES DE LA PRUEBA  No coma ni beba nada excepto agua, despus de la medianoche anterior al procedimiento.   Llegue 10 minutos antes de la cita para completar los formularios.  PRUEBA  Le tomarn la primera Cl[place]ier de [place].   Le darn una pequea botella de lquido dulce para beber.   Si tiene Programme researcher, broadcasting/film/video (nuseas) y vmitos, infrmelo al tcnico.   No coma ni beba nada excepto agua.   No mastique goma de Theatre manager.   Durante la prueba Education officer, environmental cerca del laborarorio de modo que puedan tomarle a tiempo las otras muestras de [place]. Esto es importante. Si las muestras de [place] no se toman a Chief Strategy Officer, la prueba deber repetirse Banker.   Cuando sea el momento, el tcnico de laboratorio le har saber que debern tomarle las [place].   La segunda muestra de sangre deber tomarse una hora despus de que haya bebido el lquido.   Luego de [place], le tomarn una tercera [place].   Luego de otra hora ms, le tomarn la ltima [place].  El tiempo puede variar entre los diferentes laboratorios. En el laboratorio le dirn IT consultant extraern las muestras de [place]. DESPUS DE LA PRUEBA  Puede comer y beber como lo hace habitualmente.   Consulte con su mdico la fecha en que los resultados estarn disponibles. Asegrese de [employer].  Document Released: 11/17/2010 Document Revised: 07/22/2011 [facility] Patient Information 2012 [place], [place].

## 2012-03-24 ENCOUNTER — Encounter: Payer: Self-pay | Admitting: Medical

## 2012-04-05 ENCOUNTER — Inpatient Hospital Stay (HOSPITAL_COMMUNITY): Payer: Medicaid Other

## 2012-04-05 ENCOUNTER — Encounter (HOSPITAL_COMMUNITY): Payer: Self-pay | Admitting: *Deleted

## 2012-04-05 ENCOUNTER — Inpatient Hospital Stay (HOSPITAL_COMMUNITY)
Admission: AD | Admit: 2012-04-05 | Discharge: 2012-04-09 | DRG: 774 | Disposition: A | Discharge status: Home / Self Care | Payer: Medicaid Other | Source: Ambulatory Visit | Attending: Obstetrics & Gynecology | Admitting: Obstetrics & Gynecology

## 2012-04-05 DIAGNOSIS — O4290 Premature rupture of membranes, unspecified as to length of time between rupture and onset of labor, unspecified weeks of gestation: Secondary | ICD-10-CM | POA: Diagnosis present

## 2012-04-05 DIAGNOSIS — IMO0002 Reserved for concepts with insufficient information to code with codable children: Secondary | ICD-10-CM

## 2012-04-05 DIAGNOSIS — O1210 Gestational proteinuria, unspecified trimester: Secondary | ICD-10-CM

## 2012-04-05 DIAGNOSIS — O3430 Maternal care for cervical incompetence, unspecified trimester: Principal | ICD-10-CM | POA: Diagnosis present

## 2012-04-05 DIAGNOSIS — O09529 Supervision of elderly multigravida, unspecified trimester: Secondary | ICD-10-CM | POA: Diagnosis present

## 2012-04-05 DIAGNOSIS — O99810 Abnormal glucose complicating pregnancy: Secondary | ICD-10-CM

## 2012-04-05 LAB — WET PREP, GENITAL: Clue Cells Wet Prep HPF POC: NONE SEEN

## 2012-04-05 LAB — URINALYSIS, ROUTINE W REFLEX MICROSCOPIC
Bilirubin Urine: NEGATIVE
Ketones, ur: NEGATIVE mg/dL
Protein, ur: NEGATIVE mg/dL
Urobilinogen, UA: 1 mg/dL (ref 0.0–1.0)

## 2012-04-05 LAB — URINE MICROSCOPIC-ADD ON

## 2012-04-05 MED ORDER — BETAMETHASONE SOD PHOS & ACET 6 (3-3) MG/ML IJ SUSP
12.0000 mg | INTRAMUSCULAR | Status: AC
  Administered 2012-04-05 – 2012-04-06 (×2): 12 mg via INTRAMUSCULAR
  Filled 2012-04-05 (×2): qty 2

## 2012-04-05 MED ORDER — PENICILLIN G POTASSIUM 5000000 UNITS IJ SOLR
5.0000 10*6.[IU] | Freq: Once | INTRAVENOUS | Status: AC
  Administered 2012-04-05: 5 10*6.[IU] via INTRAVENOUS
  Filled 2012-04-05: qty 5

## 2012-04-05 MED ORDER — CALCIUM CARBONATE ANTACID 500 MG PO CHEW: 2.0000 | CHEWABLE_TABLET | ORAL | Status: DC | PRN

## 2012-04-05 MED ORDER — ONDANSETRON HCL 4 MG/2ML IJ SOLN: 4.0000 mg | Freq: Four times a day (QID) | INTRAMUSCULAR | Status: DC | PRN

## 2012-04-05 MED ORDER — LACTATED RINGERS IV SOLN
INTRAVENOUS | Status: DC
  Administered 2012-04-05 – 2012-04-07 (×3): via INTRAVENOUS

## 2012-04-05 MED ORDER — HYDROMORPHONE HCL PF 1 MG/ML IJ SOLN
1.0000 mg | INTRAMUSCULAR | Status: DC | PRN
  Administered 2012-04-05: 2 mg via INTRAVENOUS
  Administered 2012-04-06 – 2012-04-07 (×2): 1 mg via INTRAVENOUS
  Filled 2012-04-05: qty 1
  Filled 2012-04-05: qty 2
  Filled 2012-04-05 (×2): qty 1

## 2012-04-05 MED ORDER — ACETAMINOPHEN 325 MG PO TABS: 650.0000 mg | ORAL_TABLET | ORAL | Status: DC | PRN

## 2012-04-05 MED ORDER — SODIUM CHLORIDE 0.9 % IV SOLN
Freq: Once | INTRAVENOUS | Status: AC
  Administered 2012-04-05: 17:00:00 via INTRAVENOUS

## 2012-04-05 MED ORDER — LACTATED RINGERS IV SOLN
Freq: Once | INTRAVENOUS | Status: DC
Start: 2012-04-05 — End: 2012-04-05

## 2012-04-05 MED ORDER — DEXTROSE 5 % IV SOLN
1.0000 g | Freq: Once | INTRAVENOUS | Status: AC
  Administered 2012-04-05: 1 g via INTRAVENOUS
  Filled 2012-04-05: qty 10

## 2012-04-05 MED ORDER — PRENATAL MULTIVITAMIN CH
1.0000 | ORAL_TABLET | Freq: Every day | ORAL | Status: DC
  Administered 2012-04-05 – 2012-04-06 (×2): 1 via ORAL
  Filled 2012-04-05 (×2): qty 1

## 2012-04-05 MED ORDER — PENICILLIN G POTASSIUM 5000000 UNITS IJ SOLR
2.5000 10*6.[IU] | INTRAVENOUS | Status: DC
  Administered 2012-04-06 – 2012-04-07 (×10): 2.5 10*6.[IU] via INTRAVENOUS
  Filled 2012-04-05 (×13): qty 2.5

## 2012-04-05 MED ORDER — INDOMETHACIN 25 MG PO CAPS
25.0000 mg | ORAL_CAPSULE | Freq: Four times a day (QID) | ORAL | Status: DC
  Administered 2012-04-06 – 2012-04-07 (×5): 25 mg via ORAL
  Filled 2012-04-05 (×6): qty 1

## 2012-04-05 MED ORDER — ZOLPIDEM TARTRATE 5 MG PO TABS: 5.0000 mg | ORAL_TABLET | Freq: Every evening | ORAL | Status: DC | PRN

## 2012-04-05 MED ORDER — INDOMETHACIN 50 MG PO CAPS
50.0000 mg | ORAL_CAPSULE | Freq: Once | ORAL | Status: AC
  Administered 2012-04-05: 50 mg via ORAL
  Filled 2012-04-05: qty 1

## 2012-04-05 MED ORDER — DOCUSATE SODIUM 100 MG PO CAPS
100.0000 mg | ORAL_CAPSULE | Freq: Every day | ORAL | Status: DC
  Administered 2012-04-05: 100 mg via ORAL
  Filled 2012-04-05: qty 1

## 2012-04-05 MED ORDER — MAGNESIUM SULFATE 40 G IN LACTATED RINGERS - SIMPLE
3.0000 g/h | INTRAVENOUS | Status: DC
  Administered 2012-04-06 – 2012-04-07 (×2): 3 g/h via INTRAVENOUS
  Filled 2012-04-05 (×3): qty 500

## 2012-04-05 MED ORDER — MAGNESIUM SULFATE BOLUS VIA INFUSION
4.0000 g | Freq: Once | INTRAVENOUS | Status: AC
  Administered 2012-04-05: 4 g via INTRAVENOUS
  Filled 2012-04-05: qty 500

## 2012-04-05 NOTE — MAU Note (Signed)
Patient states she had a yellow mucus discharge that started yesterday, less today. States she started having lower abdominal pressure, vaginal pain and low back pain this am. Reports fetal movement.

## 2012-04-05 NOTE — MAU Note (Signed)
Pt states she started having discharge yesterday at 1300 and pain started around 1800 she started having pressure and this morning around 0600 pt  Started feeling pain

## 2012-04-05 NOTE — Progress Notes (Signed)
Patient ID: [name], female   DOB: May 03, 1976, 36 y.o.   MRN: [id number]  S:  Called to room by RN. Pt moaning in pain. States feels like about to have a baby, contractions with abdominal and hip pain bilaterally.  O:   Filed Vitals:   04/05/12 2221  BP: 103/62  Pulse: 101  Temp:   Resp: 22   Afebrile. Cervix fingertip, very thin, cerclage in place, knot palpated. No bulging membranes felt.  A/P:  Increase Mag to 3 g/hour. Will give dilaudid for pain and hope to stop contractions.

## 2012-04-05 NOTE — MAU Provider Note (Signed)
History     CSN: [id number]  Arrival date and time: 04/05/12 1515   First Provider Initiated Contact with Patient 04/05/12 1619      Chief Complaint  Patient presents with  . Vaginal Discharge  . Vaginal Pain  . Back Pain   HPI [name] 36 y.o. [redacted]w[redacted]d   Comes to MAU with some lower abdominal pain and low back pain which she has had since 6 am today.  Was having vaginal pressure yesterday since 6 pm with a mucus discharge.  Has a cerclage in place.  [facility]  Inhouse Interpreter present.   OB History    Grav Para Term Preterm Abortions TAB SAB Ect Mult Living   4 2 2  0 1 0 1 0 0 2      Past Medical History  Diagnosis Date  . No pertinent past medical history     Past Surgical History  Procedure Date  . Dilation and curettage of uterus   . Cholecystectomy   . Cesarean section   . Cervical cerclage 01/05/2012    Procedure: CERCLAGE CERVICAL;  Surgeon: Adam Phenix, MD;  Location: [facility];  Service: Gynecology;  Laterality: N/A;    Family History  Problem Relation Age of Onset  . Anesthesia problems Neg Hx    OB History    Grav Para Term Preterm Abortions TAB SAB Ect Mult Living   4 2 2  0 1 0 1 0 0 2      History  Substance Use Topics  . Smoking status: Never Smoker   . Smokeless tobacco: Never Used  . Alcohol Use: No    Allergies: No Known Allergies  Prescriptions prior to admission  Medication Sig Dispense Refill  . Prenat Vit-FePoly-Methylfol-FA 29-1.13-0.4 MG TABS Take 1 tablet by mouth 1 day or 1 dose.  30 tablet  5       Review of Systems  Constitutional: Negative for fever.  Gastrointestinal: Positive for abdominal pain. Negative for nausea, vomiting, diarrhea and constipation.  Genitourinary:       Vaginal discharge - mucus No vaginal bleeding. No dysuria.  Musculoskeletal: Positive for back pain.   Physical Exam   Blood pressure 99/57, pulse 105, temperature 98.2 F (36.8 C), temperature source Oral,  resp. rate 18, height 5' 2.25" (1.581 m), weight 220 lb 12.8 oz (100.154 kg), last menstrual period 10/01/2011, SpO2 99.00%.  Physical Exam  Nursing note and vitals reviewed. Constitutional: She is oriented to person, place, and time. She appears well-developed and well-nourished.  HENT:  Head: Normocephalic.  Eyes: EOM are normal.  Neck: Neck supple.  GI: Soft. There is no tenderness.       Having contractions at 4-6 minutes which she feels.  Musculoskeletal: Normal range of motion.  Neurological: She is alert and oriented to person, place, and time.  Skin: Skin is warm and dry.  Psychiatric: She has a normal mood and affect.    MAU Course  Procedures Results for orders placed during the hospital encounter of 04/05/12 (from the past 24 hour(s))  URINALYSIS, ROUTINE W REFLEX MICROSCOPIC     Status: Abnormal   Collection Time   04/05/12  3:45 PM      Component Value Range   Color, Urine YELLOW  YELLOW   APPearance CLEAR  CLEAR   Specific Gravity, Urine 1.020  1.005 - 1.030   pH 6.5  5.0 - 8.0   Glucose, UA NEGATIVE  NEGATIVE mg/dL   Hgb urine dipstick  MODERATE (*) NEGATIVE   Bilirubin Urine NEGATIVE  NEGATIVE   Ketones, ur NEGATIVE  NEGATIVE mg/dL   Protein, ur NEGATIVE  NEGATIVE mg/dL   Urobilinogen, UA 1.0  0.0 - 1.0 mg/dL   Nitrite NEGATIVE  NEGATIVE   Leukocytes, UA SMALL (*) NEGATIVE  URINE MICROSCOPIC-ADD ON     Status: Abnormal   Collection Time   04/05/12  3:45 PM      Component Value Range   Squamous Epithelial / LPF FEW (*) RARE   WBC, UA 3-6  <3 WBC/hpf   RBC / HPF 7-10  <3 RBC/hpf   Bacteria, UA MANY (*) RARE   Urine-Other MUCOUS PRESENT     MDM 1630  Consult with Dr. Debroah Loop re: plan of care Urine culture pending Will given IVF and Rocephin IV to cover for possible UTI    Assessment and Plan  Care assumed by Erskine Emery, CNM at 1645  BURLESON,TERRI 04/05/2012, 4:30 PM   Pelvic exam (speculum):  Cervix visually closed, thin watery discharge; knot  visualized and palpated digitally.  No vaginal bleeding noted.    Transvaginal ultrasound:  Pending  FHR 150's, +accels, reactive  Toco - irritability  A:  Cervical Incompetence  P: Admit to antenatal BMZ, Mag, IV antibiotics Consult with MFM and neonatologist Despina Hidden) [facility]

## 2012-04-05 NOTE — MAU Note (Signed)
Patient has had a cerclage since about 16 weeks.

## 2012-04-05 NOTE — H&P (Signed)
History     CSN: [id number]  Arrival date and time: 04/05/12 1515   First Provider Initiated Contact with Patient 04/05/12 1619      Chief Complaint  Patient presents with  . Vaginal Discharge  . Vaginal Pain  . Back Pain   HPI [name] 36 y.o. [redacted]w[redacted]d   Comes to MAU with some lower abdominal pain and low back pain which she has had since 6 am today.  Was having vaginal pressure yesterday since 6 pm with a mucus discharge.  Has a cerclage in place.  [facility]  Inhouse Interpreter present.   OB History    Grav Para Term Preterm Abortions TAB SAB Ect Mult Living   4 2 2 0 1 0 1 0 0 2      Past Medical History  Diagnosis Date  . No pertinent past medical history     Past Surgical History  Procedure Date  . Dilation and curettage of uterus   . Cholecystectomy   . Cesarean section   . Cervical cerclage 01/05/2012    Procedure: CERCLAGE CERVICAL;  Surgeon: James G Arnold, MD;  Location: [facility];  Service: Gynecology;  Laterality: N/A;    Family History  Problem Relation Age of Onset  . Anesthesia problems Neg Hx    OB History    Grav Para Term Preterm Abortions TAB SAB Ect Mult Living   4 2 2 0 1 0 1 0 0 2      History  Substance Use Topics  . Smoking status: Never Smoker   . Smokeless tobacco: Never Used  . Alcohol Use: No    Allergies: No Known Allergies  Prescriptions prior to admission  Medication Sig Dispense Refill  . Prenat Vit-FePoly-Methylfol-FA 29-1.13-0.4 MG TABS Take 1 tablet by mouth 1 day or 1 dose.  30 tablet  5       Review of Systems  Constitutional: Negative for fever.  Gastrointestinal: Positive for abdominal pain. Negative for nausea, vomiting, diarrhea and constipation.  Genitourinary:       Vaginal discharge - mucus No vaginal bleeding. No dysuria.  Musculoskeletal: Positive for back pain.   Physical Exam   Blood pressure 99/57, pulse 105, temperature 98.2 F (36.8 C), temperature source Oral,  resp. rate 18, height 5' 2.25" (1.581 m), weight 220 lb 12.8 oz (100.154 kg), last menstrual period 10/01/2011, SpO2 99.00%.  Physical Exam  Nursing note and vitals reviewed. Constitutional: She is oriented to person, place, and time. She appears well-developed and well-nourished.  HENT:  Head: Normocephalic.  Eyes: EOM are normal.  Neck: Neck supple.  GI: Soft. There is no tenderness.       Having contractions at 4-6 minutes which she feels.  Musculoskeletal: Normal range of motion.  Neurological: She is alert and oriented to person, place, and time.  Skin: Skin is warm and dry.  Psychiatric: She has a normal mood and affect.    MAU Course  Procedures Results for orders placed during the hospital encounter of 04/05/12 (from the past 24 hour(s))  URINALYSIS, ROUTINE W REFLEX MICROSCOPIC     Status: Abnormal   Collection Time   04/05/12  3:45 PM      Component Value Range   Color, Urine YELLOW  YELLOW   APPearance CLEAR  CLEAR   Specific Gravity, Urine 1.020  1.005 - 1.030   pH 6.5  5.0 - 8.0   Glucose, UA NEGATIVE  NEGATIVE mg/dL   Hgb urine dipstick   MODERATE (*) NEGATIVE   Bilirubin Urine NEGATIVE  NEGATIVE   Ketones, ur NEGATIVE  NEGATIVE mg/dL   Protein, ur NEGATIVE  NEGATIVE mg/dL   Urobilinogen, UA 1.0  0.0 - 1.0 mg/dL   Nitrite NEGATIVE  NEGATIVE   Leukocytes, UA SMALL (*) NEGATIVE  URINE MICROSCOPIC-ADD ON     Status: Abnormal   Collection Time   04/05/12  3:45 PM      Component Value Range   Squamous Epithelial / LPF FEW (*) RARE   WBC, UA 3-6  <3 WBC/hpf   RBC / HPF 7-10  <3 RBC/hpf   Bacteria, UA MANY (*) RARE   Urine-Other MUCOUS PRESENT     MDM 1630  Consult with Dr. Arnold re: plan of care Urine culture pending Will given IVF and Rocephin IV to cover for possible UTI    Assessment and Plan  Care assumed by Cayle Cordoba Muhammed, CNM at 1645  BURLESON,TERRI 04/05/2012, 4:30 PM   Pelvic exam (speculum):  Cervix visually closed, thin watery discharge; knot  visualized and palpated digitally.  No vaginal bleeding noted.    Transvaginal ultrasound:  Pending  FHR 150's, +accels, reactive  Toco - irritability  A:  Cervical Incompetence  P: Admit to antenatal BMZ, Mag, IV antibiotics Consult with MFM and neonatologist (Eure) MUHAMMAD,Jadriel Saxer  

## 2012-04-05 NOTE — Progress Notes (Signed)
Cerclage felt and aminosure and wet prep collected

## 2012-04-06 ENCOUNTER — Encounter: Payer: Self-pay | Admitting: Family Medicine

## 2012-04-06 LAB — URINE CULTURE: Colony Count: 30000

## 2012-04-06 NOTE — Progress Notes (Signed)
Patient had a large projectile vomitus. Small amount of bloody mucous noted in urine after using the bedpan. Patient cleaned, linen changed. Vital signs are WNL, refused nausea medicine. Dr. Despina Hidden in department, aware of bleeding, vomitus, FHR, and uc pattern.

## 2012-04-06 NOTE — Progress Notes (Signed)
Small to moderate amount of brown, old like blood noted on sheet. Patient placed on fetal monitor, CNM made aware. No new orders given, will continue to monitor.

## 2012-04-06 NOTE — Progress Notes (Signed)
UR Chart review completed.  

## 2012-04-06 NOTE — Progress Notes (Signed)
Patient ID: [name], female   DOB: 11/27/1975, 36 y.o.   MRN: [id number] FACULTY PRACTICE ANTEPARTUM(COMPREHENSIVE) NOTE  [name] is a 36 y.o. [id number] at [redacted]w[redacted]d by LMP, early ultrasound who is admitted for Preterm labor.   Fetal presentation is cephalic. Length of Stay:  1  Days  Subjective: Patient had emesis this am, reports contractions are much better Patient reports the fetal movement as active. Patient reports uterine contraction  activity as irregular, every 6 minutes. Patient reports  vaginal bleeding as scant staining. Patient describes fluid per vagina as None.  Vitals:  Blood pressure 99/56, pulse 101, temperature 97.9 F (36.6 C), temperature source Oral, resp. rate 18, height 5' 2.25" (1.581 m), weight 99.791 kg (220 lb), last menstrual period 10/01/2011, SpO2 99.00%. Physical Examination:  Pelvic Exam: last exam tight finger tip essentially no cervix Cervical Exam:  and found to be / / and fetal presentation is cephalic. Extremities: extremities normal, atraumatic, no cyanosis or edema with DTRs 2+ bilaterally Membranes:intact  Fetal Monitoring:  Baseline: 150 bpm, Variability: Fair (1-6 bpm), Accelerations: Non-reactive but appropriate for gestational age and Decelerations: Absent  Labs:  Recent Results (from the past 24 hour(s))  URINALYSIS, ROUTINE W REFLEX MICROSCOPIC   Collection Time   04/05/12  3:45 PM      Component Value Range   Color, Urine YELLOW  YELLOW   APPearance CLEAR  CLEAR   Specific Gravity, Urine 1.020  1.005 - 1.030   pH 6.5  5.0 - 8.0   Glucose, UA NEGATIVE  NEGATIVE mg/dL   Hgb urine dipstick MODERATE (*) NEGATIVE   Bilirubin Urine NEGATIVE  NEGATIVE   Ketones, ur NEGATIVE  NEGATIVE mg/dL   Protein, ur NEGATIVE  NEGATIVE mg/dL   Urobilinogen, UA 1.0  0.0 - 1.0 mg/dL   Nitrite NEGATIVE  NEGATIVE   Leukocytes, UA SMALL (*) NEGATIVE  URINE MICROSCOPIC-ADD ON   Collection Time   04/05/12  3:45 PM   Component Value Range   Squamous Epithelial / LPF FEW (*) RARE   WBC, UA 3-6  <3 WBC/hpf   RBC / HPF 7-10  <3 RBC/hpf   Bacteria, UA MANY (*) RARE   Urine-Other MUCOUS PRESENT    AMNISURE RUPTURE OF MEMBRANE (ROM)   Collection Time   04/05/12  4:53 PM      Component Value Range   Amnisure ROM NEGATIVE    WET PREP, GENITAL   Collection Time   04/05/12  4:53 PM      Component Value Range   Yeast Wet Prep HPF POC NONE SEEN  NONE SEEN   Trich, Wet Prep NONE SEEN  NONE SEEN   Clue Cells Wet Prep HPF POC NONE SEEN  NONE SEEN   WBC, Wet Prep HPF POC MODERATE (*) NONE SEEN  GROUP B STREP BY PCR   Collection Time   04/05/12 10:25 PM      Component Value Range   Group B strep by PCR NEGATIVE  NEGATIVE    Imaging Studies:     Currently EPIC will not allow sonographic studies to automatically populate into notes.  In the meantime, copy and paste results into note or free text.  Medications:  Scheduled    . sodium chloride   Intravenous Once  . betamethasone acetate-betamethasone sodium phosphate  12 mg Intramuscular Q24H  . cefTRIAXone (ROCEPHIN)  IV  1 g Intravenous Once  . docusate sodium  100 mg Oral Daily  . indomethacin  50 mg Oral Once  Followed by  . indomethacin  25 mg Oral Q6H  . magnesium  4 g Intravenous Once  . pencillin G potassium IV  5 Million Units Intravenous Once   Followed by  . pencillin G potassium IV  2.5 Million Units Intravenous Q4H  . prenatal multivitamin  1 tablet Oral Daily  . DISCONTD: lactated ringers   Intravenous Once   I have reviewed the patient's current medications.  ASSESSMENT: 1.  IUP at 26 weeks 6 days 2.  Cervical incompetence status post cerclage in early 2nd trimester 3.  Preterm labor  PLAN: Continue Magnesium sulfate, status post betamethasone, continue antibiotics, finish 48 hours of indocin  EURE,LUTHER H 04/06/2012,7:34 AM

## 2012-04-06 NOTE — Progress Notes (Signed)
[name] is a 36 y.o. [id number] at [redacted]w[redacted]d admitted to antepartum for no measurable cervix, cerclage in place.  Subjective: Pt reports cramping pain is very little, but feels tired because of the magnesium and pain medication.  S/o at bedside for support.  Objective: BP 113/64  Pulse 100  Temp 98.4 F (36.9 C) (Oral)  Resp 20  Ht 5' 2.25" (1.581 m)  Wt 99.791 kg (220 lb)  BMI 39.92 kg/m2  SpO2 99%  LMP 10/01/2011 I/O last 3 completed shifts: In: 1547.5 [P.O.:220; I.V.:977.5; IV Piggyback:350] Out: 950 [Urine:950]    FHT:  FHR: 145 bpm, variability: moderate,  accelerations:  Present (10x10),  decelerations:  Absent UC:   irregular, occasional SVE:    Deferred  Labs: Lab Results  Component Value Date   WBC 8.3 12/28/2011   HGB 12.3 12/28/2011   HCT 37.8 12/28/2011   MCV 86.3 12/28/2011   PLT 239 12/28/2011    Assessment / Plan: Observation for shortening cervix with cerclage Magnesium sulfate at 3g/hour  Preeclampsia:  N/A Fetal Wellbeing:  Category I Pain Control:  Dilaudid IV, last dose 0325 I/D:  n/a Anticipated MOD:  SVD  Encouraged pt and s/o to rest as much as possible. Questions answered.  LEFTWICH-KIRBY, Kamin Niblack 04/06/2012, 9:05 AM

## 2012-04-07 ENCOUNTER — Inpatient Hospital Stay (HOSPITAL_COMMUNITY): Payer: Medicaid Other

## 2012-04-07 DIAGNOSIS — O3430 Maternal care for cervical incompetence, unspecified trimester: Secondary | ICD-10-CM

## 2012-04-07 DIAGNOSIS — O09529 Supervision of elderly multigravida, unspecified trimester: Secondary | ICD-10-CM

## 2012-04-07 DIAGNOSIS — O4290 Premature rupture of membranes, unspecified as to length of time between rupture and onset of labor, unspecified weeks of gestation: Secondary | ICD-10-CM

## 2012-04-07 LAB — CBC
HCT: 32.8 % — ABNORMAL LOW (ref 36.0–46.0)
Hemoglobin: 10.8 g/dL — ABNORMAL LOW (ref 12.0–15.0)
MCH: 28.4 pg (ref 26.0–34.0)
MCV: 86.3 fL (ref 78.0–100.0)
RBC: 3.8 MIL/uL — ABNORMAL LOW (ref 3.87–5.11)

## 2012-04-07 MED ORDER — SIMETHICONE 80 MG PO CHEW: 80.0000 mg | CHEWABLE_TABLET | ORAL | Status: DC | PRN

## 2012-04-07 MED ORDER — DIBUCAINE 1 % RE OINT: 1.0000 "application " | TOPICAL_OINTMENT | RECTAL | Status: DC | PRN

## 2012-04-07 MED ORDER — EPHEDRINE 5 MG/ML INJ: 10.0000 mg | INTRAVENOUS | Status: DC | PRN

## 2012-04-07 MED ORDER — ONDANSETRON HCL 4 MG PO TABS: 4.0000 mg | ORAL_TABLET | ORAL | Status: DC | PRN

## 2012-04-07 MED ORDER — TETANUS-DIPHTH-ACELL PERTUSSIS 5-2.5-18.5 LF-MCG/0.5 IM SUSP: 0.5000 mL | Freq: Once | INTRAMUSCULAR | Status: DC

## 2012-04-07 MED ORDER — ZOLPIDEM TARTRATE 5 MG PO TABS: 5.0000 mg | ORAL_TABLET | Freq: Every evening | ORAL | Status: DC | PRN

## 2012-04-07 MED ORDER — MISOPROSTOL 200 MCG PO TABS
800.0000 ug | ORAL_TABLET | Freq: Once | ORAL | Status: AC
  Administered 2012-04-07: 800 ug via VAGINAL

## 2012-04-07 MED ORDER — IBUPROFEN 600 MG PO TABS
600.0000 mg | ORAL_TABLET | Freq: Four times a day (QID) | ORAL | Status: DC
  Administered 2012-04-07: 600 mg via ORAL
  Filled 2012-04-07: qty 1

## 2012-04-07 MED ORDER — SODIUM CHLORIDE 0.9 % IV SOLN
500.0000 mg | Freq: Four times a day (QID) | INTRAVENOUS | Status: DC
  Filled 2012-04-07: qty 500

## 2012-04-07 MED ORDER — BUTORPHANOL TARTRATE 1 MG/ML IJ SOLN
1.0000 mg | Freq: Once | INTRAMUSCULAR | Status: AC
  Administered 2012-04-07: 1 mg via INTRAVENOUS
  Filled 2012-04-07: qty 1

## 2012-04-07 MED ORDER — PRENATAL MULTIVITAMIN CH: 1.0000 | ORAL_TABLET | Freq: Every day | ORAL | Status: DC

## 2012-04-07 MED ORDER — OXYCODONE-ACETAMINOPHEN 5-325 MG PO TABS: 2.0000 | ORAL_TABLET | ORAL | Status: DC | PRN

## 2012-04-07 MED ORDER — PRENATAL MULTIVITAMIN CH
1.0000 | ORAL_TABLET | Freq: Every day | ORAL | Status: DC
  Administered 2012-04-08: 1 via ORAL
  Filled 2012-04-07: qty 1

## 2012-04-07 MED ORDER — PHENYLEPHRINE 40 MCG/ML (10ML) SYRINGE FOR IV PUSH (FOR BLOOD PRESSURE SUPPORT): 80.0000 ug | PREFILLED_SYRINGE | INTRAVENOUS | Status: DC | PRN

## 2012-04-07 MED ORDER — IBUPROFEN 400 MG PO TABS: 400.0000 mg | ORAL_TABLET | ORAL | Status: DC | PRN

## 2012-04-07 MED ORDER — WITCH HAZEL-GLYCERIN EX PADS: 1.0000 "application " | MEDICATED_PAD | CUTANEOUS | Status: DC | PRN

## 2012-04-07 MED ORDER — BUTORPHANOL TARTRATE 1 MG/ML IJ SOLN
1.0000 mg | Freq: Once | INTRAMUSCULAR | Status: AC
  Administered 2012-04-07: 1 mg via INTRAVENOUS

## 2012-04-07 MED ORDER — BENZOCAINE-MENTHOL 20-0.5 % EX AERO
1.0000 "application " | INHALATION_SPRAY | CUTANEOUS | Status: DC | PRN
  Filled 2012-04-07: qty 56

## 2012-04-07 MED ORDER — ONDANSETRON HCL 4 MG/2ML IJ SOLN: 4.0000 mg | INTRAMUSCULAR | Status: DC | PRN

## 2012-04-07 MED ORDER — SENNOSIDES-DOCUSATE SODIUM 8.6-50 MG PO TABS
2.0000 | ORAL_TABLET | Freq: Every day | ORAL | Status: DC
  Administered 2012-04-07 – 2012-04-08 (×2): 2 via ORAL

## 2012-04-07 MED ORDER — LANOLIN HYDROUS EX OINT: TOPICAL_OINTMENT | CUTANEOUS | Status: DC | PRN

## 2012-04-07 MED ORDER — BUTORPHANOL TARTRATE 1 MG/ML IJ SOLN
INTRAMUSCULAR | Status: AC
  Filled 2012-04-07: qty 1

## 2012-04-07 MED ORDER — MISOPROSTOL 200 MCG PO TABS
ORAL_TABLET | ORAL | Status: AC
  Filled 2012-04-07: qty 4

## 2012-04-07 MED ORDER — SENNOSIDES-DOCUSATE SODIUM 8.6-50 MG PO TABS: 2.0000 | ORAL_TABLET | Freq: Every day | ORAL | Status: DC

## 2012-04-07 MED ORDER — BENZOCAINE-MENTHOL 20-0.5 % EX AERO: 1.0000 "application " | INHALATION_SPRAY | CUTANEOUS | Status: DC | PRN

## 2012-04-07 MED ORDER — IBUPROFEN 600 MG PO TABS
600.0000 mg | ORAL_TABLET | Freq: Four times a day (QID) | ORAL | Status: DC
  Administered 2012-04-07 – 2012-04-09 (×6): 600 mg via ORAL
  Filled 2012-04-07 (×6): qty 1

## 2012-04-07 MED ORDER — DIPHENHYDRAMINE HCL 50 MG/ML IJ SOLN: 12.5000 mg | INTRAMUSCULAR | Status: DC | PRN

## 2012-04-07 MED ORDER — DIPHENHYDRAMINE HCL 25 MG PO CAPS: 25.0000 mg | ORAL_CAPSULE | Freq: Four times a day (QID) | ORAL | Status: DC | PRN

## 2012-04-07 MED ORDER — OXYTOCIN 40 UNITS IN LACTATED RINGERS INFUSION - SIMPLE MED
INTRAVENOUS | Status: AC
  Administered 2012-04-07: 40 [IU]
  Filled 2012-04-07: qty 1000

## 2012-04-07 MED ORDER — OXYCODONE-ACETAMINOPHEN 5-325 MG PO TABS
1.0000 | ORAL_TABLET | ORAL | Status: DC | PRN
  Administered 2012-04-07: 1 via ORAL
  Filled 2012-04-07: qty 1

## 2012-04-07 MED ORDER — LACTATED RINGERS IV BOLUS (SEPSIS)
500.0000 mL | Freq: Once | INTRAVENOUS | Status: AC
  Administered 2012-04-07: 500 mL via INTRAVENOUS

## 2012-04-07 MED ORDER — OXYCODONE-ACETAMINOPHEN 5-325 MG PO TABS
1.0000 | ORAL_TABLET | ORAL | Status: DC | PRN
  Administered 2012-04-08: 1 via ORAL
  Filled 2012-04-07: qty 1

## 2012-04-07 MED ORDER — SODIUM CHLORIDE 0.9 % IV SOLN
2.0000 g | Freq: Four times a day (QID) | INTRAVENOUS | Status: DC
  Filled 2012-04-07 (×2): qty 2000

## 2012-04-07 MED ORDER — FENTANYL 2.5 MCG/ML BUPIVACAINE 1/10 % EPIDURAL INFUSION (WH - ANES): 14.0000 mL/h | INTRAMUSCULAR | Status: DC

## 2012-04-07 MED ORDER — LACTATED RINGERS IV SOLN: 500.0000 mL | Freq: Once | INTRAVENOUS | Status: DC

## 2012-04-07 MED ORDER — TETANUS-DIPHTH-ACELL PERTUSSIS 5-2.5-18.5 LF-MCG/0.5 IM SUSP
0.5000 mL | Freq: Once | INTRAMUSCULAR | Status: AC
  Administered 2012-04-09: 0.5 mL via INTRAMUSCULAR

## 2012-04-07 NOTE — Consult Note (Signed)
Neonatology Note:   Attendance at Delivery:    I was asked to attend this NSVD at 27 0/[redacted] weeks GA following onset of labor. The mother is a G4P2A1 A pos, GBS neg with incompetent cervix and cerclage. She was admitted on 8/21 for onset of PTL and received 2 doses of Betamethasone, Pen G, and magnesium sulfate. The cerclage was clipped and labor has progressed. ROM 9 hours prior to delivery, fluid clear. Infant delivered vertex and was vigorous with good spontaneous cry and tone. We bulb suctioned and gave stimulation when the baby took pauses in breathing; he was sufficiently vigorous to try the neopuff. He did well on this. O2 saturations steadily rose on 50% FIO2 and this was weaned once the O2 saturations reached the 90s. His breathing was quite regular on the neopuff with no fluctuations in his HR. Equal breath sounds and good air movement could be heard bilaterally. Ap 8/9. Seen briefly by both parents in the DR.  Transported to the NICU on the Neopuff with his father in attendance.Deatra James, MD

## 2012-04-07 NOTE — Progress Notes (Signed)
Subjective: Pt reports increase pain with contractions.  Desires IV pain medication, declines epidural.  Objective: BP 112/51  Pulse 97  Temp 98.2 F (36.8 C) (Oral)  Resp 20  Ht 5' 2.25" (1.581 m)  Wt 99.791 kg (220 lb)  BMI 39.92 kg/m2  SpO2 99%  LMP 10/01/2011 I/O last 3 completed shifts: In: 6182.5 [P.O.:1520; I.V.:3712.5; IV Piggyback:950] Out: 6530 [Urine:6530] Total I/O In: 233.8 [P.O.:120; I.V.:113.8] Out: 300 [Urine:300]  FHT:  FHR: 140's bpm, variability: moderate,  accelerations:  Present,  decelerations:  Absent UC:   irregular, every poor tracing minutes SVE:   Dilation: 5-6/50/-2 Exam by:: M. Williams,CNM  Labs: Lab Results  Component Value Date   WBC 13.5* 04/07/2012   HGB 10.8* 04/07/2012   HCT 32.8* 04/07/2012   MCV 86.3 04/07/2012   PLT 215 04/07/2012    Assessment / Plan: Spontaneous labor, progressing normally  Labor: Progressing normally Preeclampsia:  n/a Fetal Wellbeing:  Category I Pain Control:  Stadol ordered I/D:  n/a Anticipated MOD:  NSVD  [facility] 04/07/2012, 10:20 AM

## 2012-04-07 NOTE — Progress Notes (Signed)
Breast pump teaching complete using interpreter.

## 2012-04-07 NOTE — Consult Note (Signed)
Neonatology Consult to Antenatal Patient:  [name] is a patient with incompetent cervix and cerclage who was admitted on 8/21 due to onset of PTL and is now at 61 0/[redacted] weeks GA. She is currently  having active labor. She received 2 doses of BMZ and IV Pen G for several doses. She has also been on magnesium sulfate. The cerclage has ben clipped and she is now 5 cm dilated, attempting VBAC.  I spoke with the patient and her mother spoke with them in Spanish, so kept the discussion brief for now. We discussed what she can expect when the baby delivers, including usual DR management, probable respiratory distress and need for support, IV access, feedings (mother desires breast feeding, which was encouraged), and LOS. She did not have any questions at this time. Will speak more with her via interpreter after the baby delivers.  Thank you for asking me to see this patient.  Deatra James, MD Neonatologist  Time spent: 20 minutes

## 2012-04-07 NOTE — Progress Notes (Signed)
Patient transferred to L&D via bed. Report given to [facility].

## 2012-04-07 NOTE — Progress Notes (Addendum)
Patient ID: Helaine Yackel, female   DOB: 03/19/1976, 36 y.o.   MRN: [id number]  Notified earlier that there was a small amount of old blood on legs, then later no further.  Then at 0430, she was found to have wet bed linens over a large area, tinted orange/red/watery.  Fern on that external fluid was negative.    I did a speculum exam and found a large pool of yellow/red thick fluid, semi-watery in vault.  Unable to do Amnisure due to blood.  Ferning was negative, but there were profuse numbers of RBCs on slide.  Cervical exam:  2/100%/-3.  Reviewed [place] from 8/21 but no interpretation was found, only pictures.   Will get AFI now to verify fluid levels.    Pt states if she is to deliver, she wants a C/S so she can get her tubes tied.     Addendum: Verbal report from ultrasonographer:  AFI about 10, lower than 2 days ago.  Placenta normal, no obvious abruption  Will consult Dr Marice Potter

## 2012-04-07 NOTE — Progress Notes (Signed)
Large area on bed wet with watery brown liquid with small amount of blood. Fern test negative. CNM, Hilda Lias notified of findings. CNM will come and further assess patient. Will continue to monitor.

## 2012-04-07 NOTE — Progress Notes (Signed)
   Subjective: Pt reports increased pain and desires epidural.    Objective: BP 130/62  Pulse 99  Temp 97.8 F (36.6 C) (Oral)  Resp 20  Ht 5' 2.25" (1.581 m)  Wt 99.791 kg (220 lb)  BMI 39.92 kg/m2  SpO2 99%  LMP 10/01/2011 I/O last 3 completed shifts: In: 6182.5 [P.O.:1520; I.V.:3712.5; IV Piggyback:950] Out: 6530 [Urine:6530] Total I/O In: 233.8 [P.O.:120; I.V.:113.8] Out: 300 [Urine:300]  FHT:  FHR: 130's bpm, variability: moderate,  accelerations:  Present,  decelerations:  Absent UC:   Regular per palpation, every 3-4 min SVE:   Dilation: 6 Effacement (%): 80 Station: -1 Exam by:: rsk  Labs: Lab Results  Component Value Date   WBC 13.5* 04/07/2012   HGB 10.8* 04/07/2012   HCT 32.8* 04/07/2012   MCV 86.3 04/07/2012   PLT 215 04/07/2012    Assessment / Plan: Spontaneous labor, progressing normally Preterm Labor  Labor: Progressing normally Preeclampsia:  n/a Fetal Wellbeing:  Category I Pain Control:  Labor support without medications; epidural ordered I/D:  n/a Anticipated MOD:  NSVD  [facility] 04/07/2012, 2:46 PM

## 2012-04-07 NOTE — Progress Notes (Signed)
S. She feels occasional contractions. She reported a large increase in her vaginal fluid early this morning. O. VSS, AF     FHR- reassuring     CTX- none     SSE- gross rupture of fluid, some blood tinged, some brownish/greenish clumps (?mec), membranes protruding through 2 cm open cervix.  A/P. PROM at 27 1/[redacted] weeks EGA. I moved her to L & D and was able to cut her merselene cerclage. I was able to pull it half way out but I had to stop due to patient discomfort. We have discussed delivery routes (She had a c/s in the past). She wants a PPS and signed the MCD papers yesterday. I have recommended a NSVD and she has agreed.

## 2012-04-07 NOTE — Progress Notes (Signed)
[name] is a 36 y.o. [id number] at [redacted]w[redacted]d   Subjective: Pain minimal  Objective: BP 109/54  Pulse 96  Temp 97.5 F (36.4 C) (Oral)  Resp 20  Ht 5' 2.25" (1.581 m)  Wt 99.791 kg (220 lb)  BMI 39.92 kg/m2  SpO2 99%  LMP 10/01/2011 I/O last 3 completed shifts: In: 6182.5 [P.O.:1520; I.V.:3712.5; IV Piggyback:950] Out: 6530 [Urine:6530] Total I/O In: 233.8 [P.O.:120; I.V.:113.8] Out: 300 [Urine:300]  FHT:  FHR: 150 bpm, variability: moderate,  accelerations:  Present,  decelerations:  Absent UC:   none SVE:   Dilation: 5 Effacement (%): 50% Station: -2 Exam by:: Erasmo Leventhal, MD  Labs: Lab Results  Component Value Date   WBC 13.5* 04/07/2012   HGB 10.8* 04/07/2012   HCT 32.8* 04/07/2012   MCV 86.3 04/07/2012   PLT 215 04/07/2012    Assessment / Plan: Spontaneous labor, progressing normally  Labor: Progressing normally Preeclampsia:  n/a Fetal Wellbeing:  Category I Pain Control:  Stadol with pain relief I/D:  n/a Anticipated MOD:  NSVD  HUNTER, STEPHEN 04/07/2012, 12:38 PM

## 2012-04-08 LAB — CBC
MCH: 28.8 pg (ref 26.0–34.0)
MCHC: 33.1 g/dL (ref 30.0–36.0)
MCV: 87.1 fL (ref 78.0–100.0)
Platelets: 192 10*3/uL (ref 150–400)
RDW: 15 % (ref 11.5–15.5)

## 2012-04-08 NOTE — Progress Notes (Signed)
Post Partum Day 1 Subjective: no complaints, up ad lib, voiding and tolerating PO; infant doing well in NICU  Objective: Blood pressure 92/52, pulse 73, temperature 97.7 F (36.5 C), temperature source Oral, resp. rate 18, height 5' 2.25" (1.581 m), weight 99.791 kg (220 lb), last menstrual period 10/01/2011, SpO2 98.00%.  Physical Exam:  General: alert, cooperative and appears stated age Lochia: appropriate Uterine Fundus: firm Incision: n/a DVT Evaluation: No evidence of DVT seen on physical exam. Negative Homan's sign.   Basename 04/08/12 0645 04/07/12 0803  HGB 9.2* 10.8*  HCT 27.8* 32.8*    Assessment/Plan: Plan for discharge tomorrow   LOS: 3 days   [facility] 04/08/2012, 7:21 AM

## 2012-04-09 ENCOUNTER — Encounter (HOSPITAL_COMMUNITY): Payer: Self-pay | Admitting: *Deleted

## 2012-04-09 MED ORDER — FERROUS SULFATE 325 (65 FE) MG PO TABS: 325.0000 mg | ORAL_TABLET | Freq: Every day | ORAL | Status: DC

## 2012-04-09 MED ORDER — IBUPROFEN 600 MG PO TABS: 600.0000 mg | ORAL_TABLET | Freq: Four times a day (QID) | ORAL | Status: AC

## 2012-04-09 NOTE — Progress Notes (Signed)
Clinical Social Work Department PSYCHOSOCIAL ASSESSMENT - MATERNAL/CHILD 04/09/2012  Patient:  [name],[name]  Account Number:  [id number]  Admit Date:  04/05/2012  Childs Name:   [name], Boy Sayre    Clinical Social Worker:  Brysan Mcevoy, LCSW   Date/Time:  04/09/2012 11:00 AM  Date Referred:  04/09/2012   Referral source  Physician     Referred reason  NICU   Other referral source:    I:  FAMILY / HOME ENVIRONMENT Child's legal guardian:  PARENT  Guardian - Name Guardian - Age Guardian - Address  [name] 36 [address] [place], [place] [address]  [name]  [address] [place], [place] [address]   Other household support members/support persons Name Relationship DOB  2 other children in the home     Other support:   lots of family support per MOB    II  PSYCHOSOCIAL DATA Information Source:  Patient Interview  Financial and Community Resources Employment:   Financial resources:  Medicaid If Medicaid - County:  [place] Other  WIC  Food Stamps   School / Grade:   Maternity Care Coordinator / Child Services Coordination / Early Interventions:  Cultural issues impacting care:   needs interpreter    III  STRENGTHS Strengths  Adequate Resources  Supportive family/friends  Understanding of illness  Home prepared for Child (including basic supplies)  Compliance with medical plan   Strength comment:    IV  RISK FACTORS AND CURRENT PROBLEMS Current Problem:  None   Risk Factor & Current Problem Patient Issue Family Issue Risk Factor / Current Problem Comment   N N     V  SOCIAL WORK ASSESSMENT CSW spoke with MOB in room with interpreter.  CSW explained role in NICU and that CSW will be following infant during continued stay in NICU.  MOB currently lives with her spouse and 2 other children.  Discussed infant admission to NICU and MOB's emotions around admission.  MOB does not report any emotional  concerns at this time.  Discussed treatment and communication, and MOB feels she understand NICU treatment.  CSW noted MOB had medicaid and asked if there were any concerns, and MOB reported none.  MOB has WIC and is receiving loaner breast pump from hospital while she is awaiting one through WIC.  No concerns at this time as far as transportation to visit infant, however CSW instructed MOB to let CSW know if any concerns arise.  CSW asked about supplies and any concerns.  MOB does not report any concerns at this time.  CSW also discussed family support, and MOB reported good family support when infant is able to come home.  No hx of drug use and no current concerns.  CSW will continue to follow while infant is in the NICU.      VI SOCIAL WORK PLAN Social Work Plan  Psychosocial Support/Ongoing Assessment of Needs   Type of pt/family education:   If child protective services report - county:   If child protective services report - date:   Information/referral to community resources comment:   Other social work plan:    

## 2012-04-09 NOTE — Discharge Summary (Signed)
Obstetric Discharge Summary Reason for Admission: Preterm contractions with cerclage in place Prenatal Procedures: NST, ultrasound Intrapartum Procedures: spontaneous vaginal delivery and GBS prophylaxis Postpartum Procedures: none Complications-Operative and Postpartum: none Hemoglobin  Date Value Range Status  04/08/2012 9.2* 12.0 - 15.0 g/dL Final     HCT  Date Value Range Status  04/08/2012 27.8* 36.0 - 46.0 % Final    Physical Exam:  General: alert, cooperative and no distress Lochia: appropriate Uterine Fundus: firm Incision: N/a DVT Evaluation: No evidence of DVT seen on physical exam.  Discharge Diagnoses: Preterm delivery, SVD  Discharge Information: Date: 04/09/2012 Activity: pelvic rest Diet: routine Medications: Ibuprofen and Iron Condition: stable Instructions: refer to practice specific booklet Discharge to: home   Newborn Data: Live born female  Birth Weight: 2 lb 14.9 oz (1330 g) APGAR: 8, 9  Remains in NICU.  [name] 04/09/2012, 8:51 AM

## 2012-04-11 LAB — TYPE AND SCREEN: Unit division: 0

## 2012-04-24 ENCOUNTER — Encounter (HOSPITAL_COMMUNITY): Payer: Self-pay | Admitting: *Deleted

## 2012-04-27 ENCOUNTER — Encounter: Payer: Self-pay | Admitting: Family Medicine

## 2012-05-17 ENCOUNTER — Ambulatory Visit (INDEPENDENT_AMBULATORY_CARE_PROVIDER_SITE_OTHER): Payer: Self-pay | Admitting: Obstetrics & Gynecology

## 2012-05-17 ENCOUNTER — Encounter: Payer: Self-pay | Admitting: Obstetrics & Gynecology

## 2012-05-17 DIAGNOSIS — N898 Other specified noninflammatory disorders of vagina: Secondary | ICD-10-CM

## 2012-05-17 MED ORDER — METRONIDAZOLE 500 MG PO TABS: 500.0000 mg | ORAL_TABLET | Freq: Two times a day (BID) | ORAL | Status: AC

## 2012-05-17 NOTE — Patient Instructions (Signed)
Return to clinic for any scheduled appointments or for any gynecologic concerns as needed.   

## 2012-05-17 NOTE — Progress Notes (Signed)
  Subjective:     [name] is a 36 y.o. [phone] female who presents for a postpartum visit.  Encounter done with the help of a Spanish interpreter by phone Kennyth Lose). She is 6 weeks postpartum following a spontaneous vaginal delivery. I have fully reviewed the prenatal and intrapartum course. The delivery was at 27 gestational weeks, baby is still in NICU.  Postpartum course has been uncomplicated. Bleeding: no bleeding. Bowel function is normal. Bladder function is normal. Patient is sexually active. Contraception method is none. Postpartum depression screening: negative.  The following portions of the patient's history were reviewed and updated as appropriate: allergies, current medications, past family history, past medical history, past social history, past surgical history and problem list.  Review of Systems Pertinent items are noted in HPI.   Objective:    BP 94/63  Pulse 97  Temp 97.4 F (36.3 C) (Oral)  Resp 12  Ht 5\' 2"  (1.575 m)  Wt 208 lb 14.4 oz (94.756 kg)  BMI 38.21 kg/m2  Breastfeeding? Unknown  General:  alert and no distress   Breasts:  inspection negative, no nipple discharge or bleeding, no masses or nodularity palpable  Lungs: clear to auscultation bilaterally  Heart:  regular rate and rhythm  Abdomen: soft, non-tender; bowel sounds normal; no masses,  no organomegaly   Vulva:  normal  Vagina: Malodorous vaginal yellow discharge seen. Cerclage stitch noted to be in place, this was removed. Wet prep obtained.  Cervix:  normal  Corpus: normal  Adnexa:  normal adnexa and no mass, fullness, tenderness  Rectal Exam: Not performed.        Assessment:    Normal postpartum exam. Pap smear not done at today's visit, pap in 12/2011 was normal   Plan:    1. Contraception: interested in Mirena IUD. Will use barrier methods until IUD. 2. Wet prep done, will presumptively treat with Metronidazole. 3 Will see if her insurance covers Mirena IUD. If not   She will do [facility] foundation application. $250 placement fee also discussed with patient. Also discussed going to [facility]. 4. Follow up for possible IUD placement or as needed.

## 2012-05-18 LAB — WET PREP, GENITAL
Trich, Wet Prep: NONE SEEN
Yeast Wet Prep HPF POC: NONE SEEN

## 2014-05-14 IMAGING — US US OB DETAIL+14 WK
1 series · 12 of 28 positions shown · non-contrast
Comparison: none

[Series 1: us ob detail +14 wk · 12 of 72 slices shown]
[im 3/72]
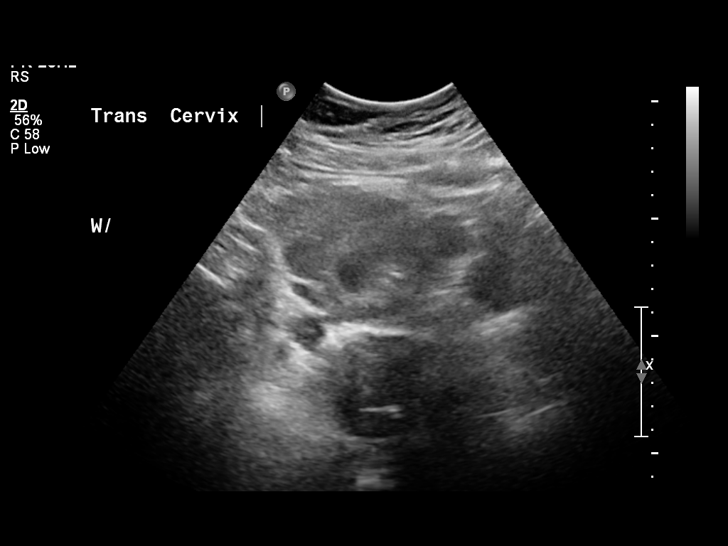
[im 8/72]
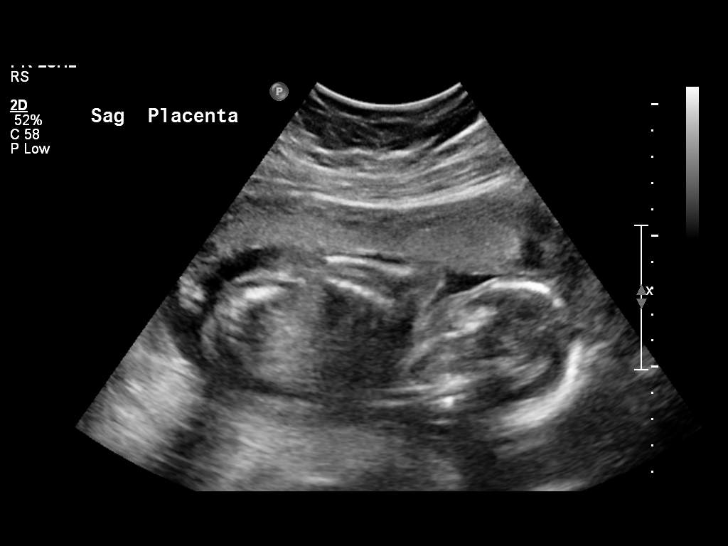
[im 14/72]
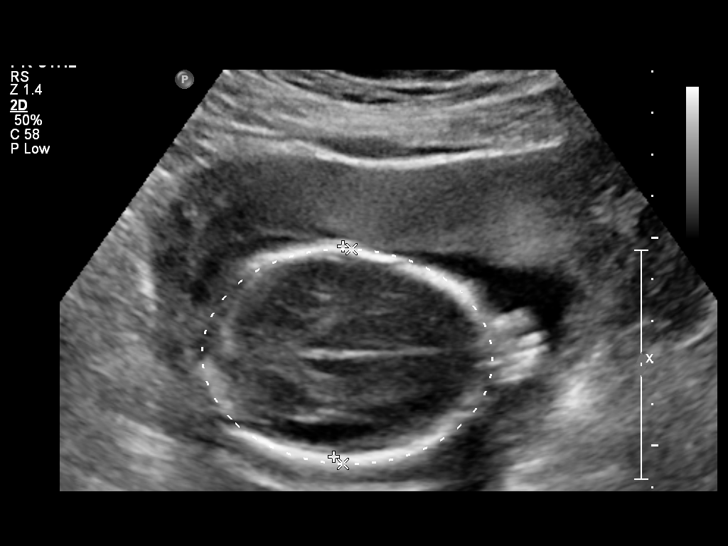
[im 22/72]
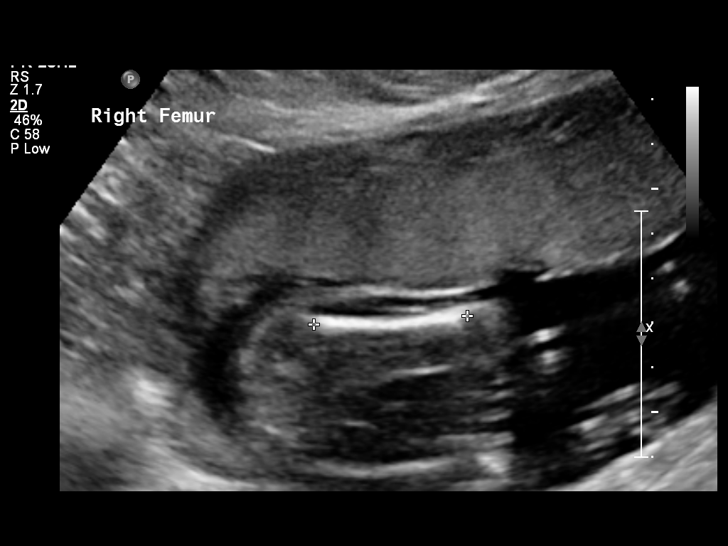
[im 27/72]
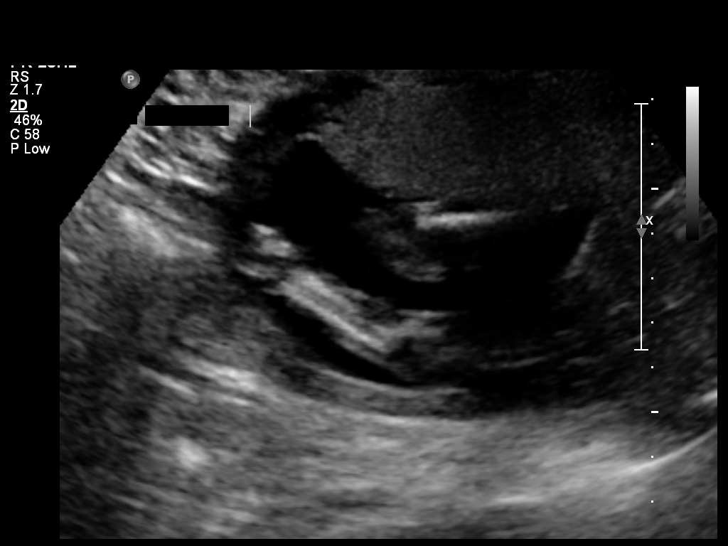
[im 32/72]
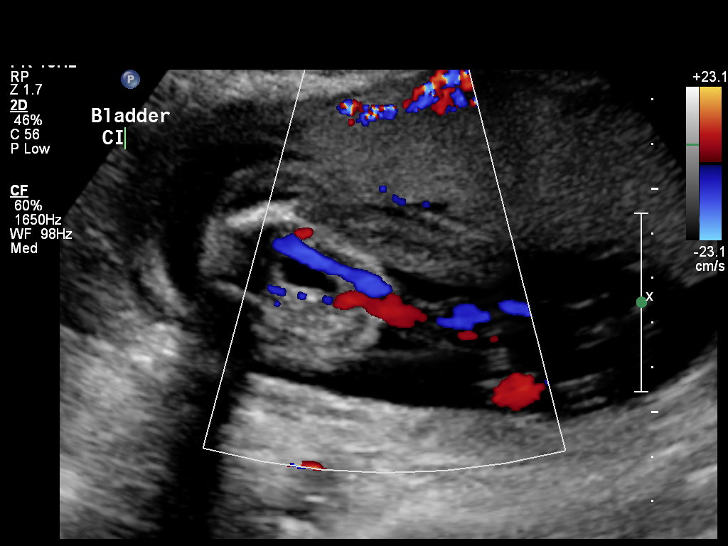
[im 40/72]
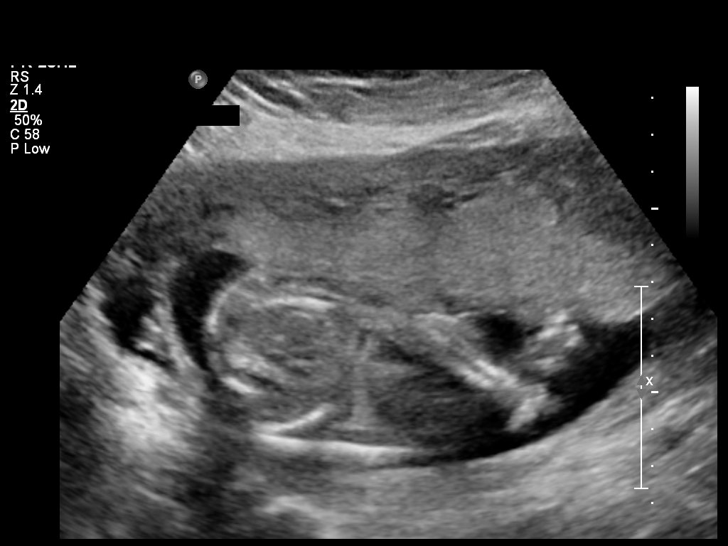
[im 45/72]
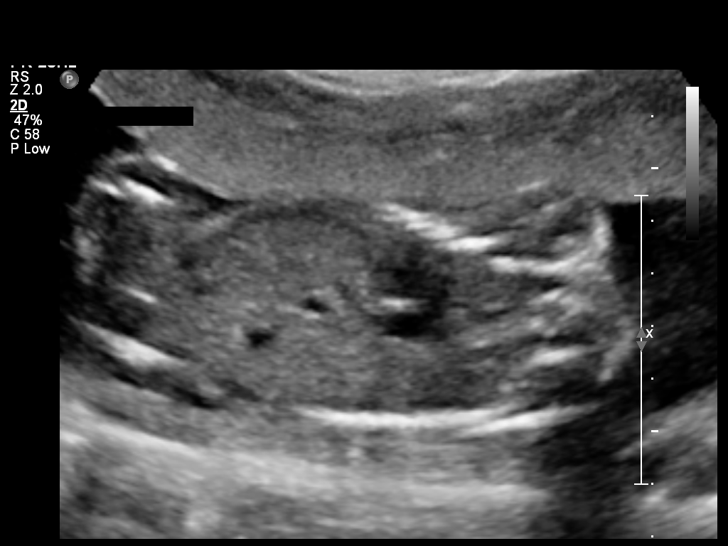
[im 50/72]
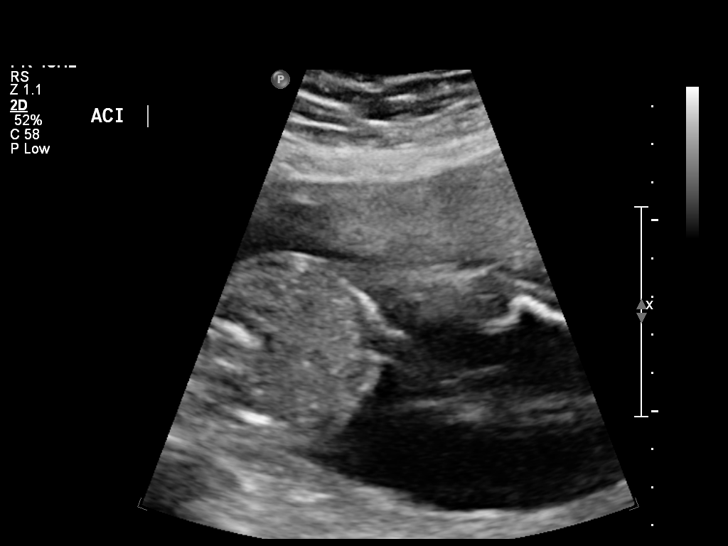
[im 58/72]
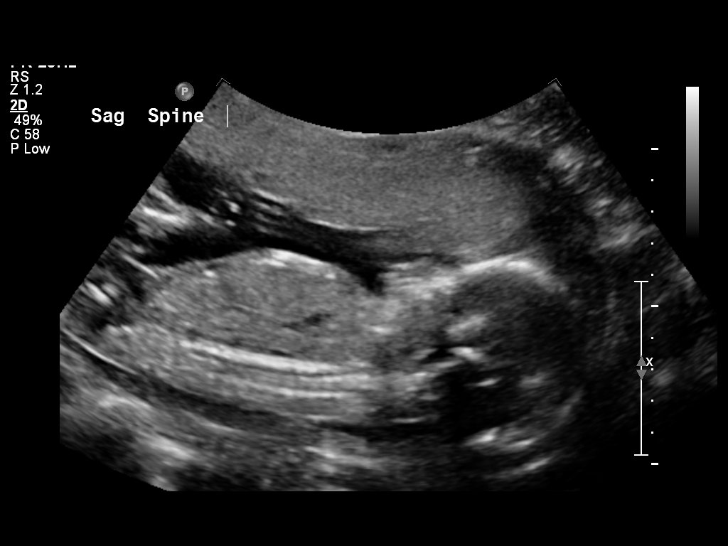
[im 64/72]
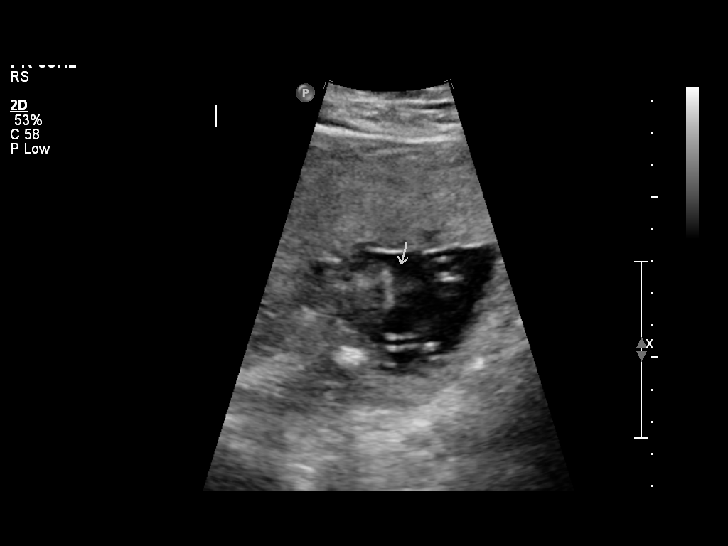
[im 69/72]
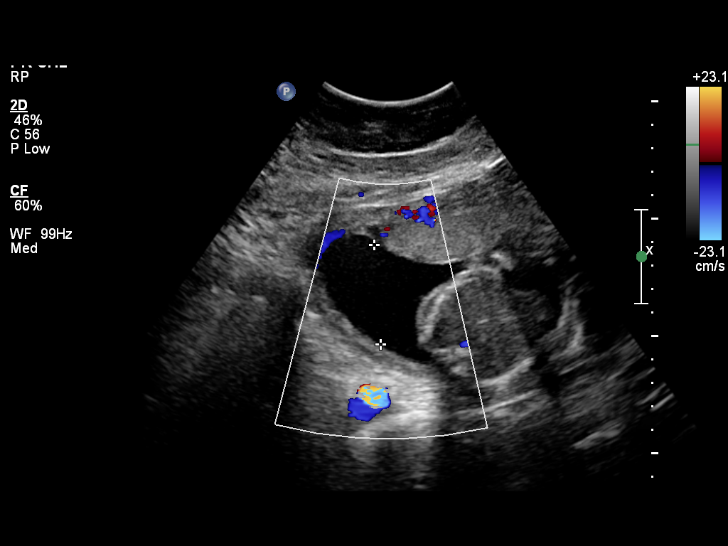

[12 of 28 positions shown; findings below may reference images not displayed]

OBSTETRICS REPORT
                      (Signed Final 02/15/2012 [DATE])

           CLAYO

 Order#:         [id number]
Procedures

 US OB DETAIL + 14 WK                                  76811.0
Indications

 Cervical insufficiency
 Detailed fetal anatomic survey
Fetal Evaluation

 Preg. Location:    Intrauterine
 Fetal Heart Rate:  170                          bpm
 Cardiac Activity:  Observed
 Presentation:      Cephalic
 Placenta:          Anterior, above cervical os

 Amniotic Fluid
 AFI FV:      Subjectively within normal limits
                                             Larg Pckt:    4.24  cm
 LLQ:   4.24    cm
Biometry

 BPD:       50  mm     G. Age:  21w 1d                CI:        69.41   70 - 86
                                                      FL/HC:      17.7   16.8 -

 HC:     191.6  mm     G. Age:  21w 3d     > 97  %    HC/AC:      1.24   1.09 -

 AC:     154.8  mm     G. Age:  20w 5d       79  %    FL/BPD:
 FL:      33.9  mm     G. Age:  20w 5d       79  %    FL/AC:      21.9   20 - 24
 HUM:     31.4  mm     G. Age:  20w 3d       75  %

 Est. FW:     374  gm    0 lb 13 oz      63  %
Gestational Age

 U/S Today:     21w 0d                                        EDD:   06/27/12
 Best:          19w 4d     Det. By:  LMP  (10/01/11)          EDD:   07/07/12
Genetic Sonogram - Trisomy 21 Screening

 Age:                                             36          Risk=1:   237
 Echogenic bowel:                                 No          LR :
 Hypoplastic/absent Nasal bone:                   No
 Choroid plexus cysts:                            No
 Structural anomalies (inc. cardiac):             No          LR :
 Hypoplastic / absent midphalanx 5th Digit:       No
 Short femur:                                     No          LR :
 Wide space 4st-5nd toes:                         No
 Short humerus:                                   No          LR :
 2-vessel umbilical cord:                         No
 Pyelectasis:                                     No          LR :
 Echogenic cardiac foci:                          No          LR :

 11 Of 11 Criteria Were Visualized and 0 Abnormal(s) Were Seen.
 Ultrasound Modified Risk for Fetal Down Syndrome = [DATE]
Anatomy

 Cranium:           Appears normal      Aortic Arch:       Not well
                                                           visualized
 Fetal Cavum:       Appears normal      Ductal Arch:       Not well
                                                           visualized
 Ventricles:        Appears normal      Diaphragm:         Appears normal
 Choroid Plexus:    Appears normal      Stomach:           Appears
                                                           normal, left
                                                           sided
 Cerebellum:        Appears normal      Abdomen:           Appears normal
 Posterior Fossa:   Appears normal      Abdominal Wall:    Appears nml
                                                           (cord insert,
                                                           abd wall)
 Nuchal Fold:       Appears normal      Cord Vessels:      Appears normal
                    (neck, nuchal                          (3 vessel cord)
                    fold)
 Face:              Not well            Kidneys:           Appear normal
                    visualized
 Heart:             Appears normal      Bladder:           Appears normal
                    (4 chamber &
                    axis)
 RVOT:              Not well            Spine:             Appears normal
                    visualized
 LVOT:              Not well            Limbs:             Four extremities
                    visualized                             seen

 Other:     Fetus appears to be a male. 5th digit visualized.
            Technically difficult due to maternal habitus and fetal
            position.
Cervix Uterus Adnexa

 Cervical Length:    3.34     cm

 Cervix:       Normal appearance by transabdominal scan.
 Uterus:       No abnormality visualized.
 Left Ovary:    No adnexal mass visualized.
 Right Ovary:   No adnexal mass visualized.
Impression

 Single living intrauterine gestation with concordant
 gestational age and normal visualized anatomy.  No
 sonographic markers for aneuploidy visualized;  see above
 for US modified Trisomy 21 risk calculation.

 Normal amniotic fluid volume and cervical length.
 KANYENVU with us.  Please do not hesitate to

## 2014-06-17 ENCOUNTER — Encounter: Payer: Self-pay | Admitting: Obstetrics & Gynecology

## 2015-02-24 ENCOUNTER — Emergency Department (HOSPITAL_COMMUNITY)
Admission: EM | Admit: 2015-02-24 | Discharge: 2015-02-24 | Disposition: A | Discharge status: Home / Self Care | Payer: Self-pay | Attending: Emergency Medicine | Admitting: Emergency Medicine

## 2015-02-24 ENCOUNTER — Encounter (HOSPITAL_COMMUNITY): Payer: Self-pay | Admitting: *Deleted

## 2015-02-24 DIAGNOSIS — L259 Unspecified contact dermatitis, unspecified cause: Secondary | ICD-10-CM | POA: Insufficient documentation

## 2015-02-24 DIAGNOSIS — Z79899 Other long term (current) drug therapy: Secondary | ICD-10-CM | POA: Insufficient documentation

## 2015-02-24 MED ORDER — DEXAMETHASONE SODIUM PHOSPHATE 10 MG/ML IJ SOLN
10.0000 mg | Freq: Once | INTRAMUSCULAR | Status: AC
  Administered 2015-02-24: 10 mg via INTRAMUSCULAR
  Filled 2015-02-24: qty 1

## 2015-02-24 MED ORDER — PREDNISONE 10 MG PO TABS: ORAL_TABLET | ORAL | Status: DC

## 2015-02-24 NOTE — Discharge Instructions (Signed)
Prednisone until all gone, first dose tomorrow. Continue benadryl, calamine lotion. Follow up as needed.   Poison [employer] Poison ivy is a inflammation of the skin (contact dermatitis) caused by touching the allergens on the leaves of the ivy plant following previous exposure to the plant. The rash usually appears 48 hours after exposure. The rash is usually bumps (papules) or blisters (vesicles) in a linear pattern. Depending on your own sensitivity, the rash may simply cause redness and itching, or it may also progress to blisters which may break open. These must be well cared for to prevent secondary bacterial (germ) infection, followed by scarring. Keep any open areas dry, clean, dressed, and covered with an antibacterial ointment if needed. The eyes may also get puffy. The puffiness is worst in the morning and gets better as the day progresses. This dermatitis usually heals without scarring, within 2 to 3 weeks without treatment. HOME CARE INSTRUCTIONS  Thoroughly wash with soap and water as soon as you have been exposed to poison ivy. You have about one half hour to remove the plant resin before it will cause the rash. This washing will destroy the oil or antigen on the skin that is causing, or will cause, the rash. Be sure to wash under your fingernails as any plant resin there will continue to spread the rash. Do not rub skin vigorously when washing affected area. Poison ivy cannot spread if no oil from the plant remains on your body. A rash that has progressed to weeping sores will not spread the rash unless you have not washed thoroughly. It is also important to wash any clothes you have been wearing as these may carry active allergens. The rash will return if you wear the unwashed clothing, even several days later. Avoidance of the plant in the future is the best measure. Poison ivy plant can be recognized by the number of leaves. Generally, poison ivy has three leaves with flowering branches on a single  stem. Diphenhydramine may be purchased over the counter and used as needed for itching. Do not drive with this medication if it makes you drowsy.Ask your caregiver about medication for children. SEEK MEDICAL CARE IF:  Open sores develop.  Redness spreads beyond area of rash.  You notice purulent (pus-like) discharge.  You have increased pain.  Other signs of infection develop (such as fever). Document Released: 07/30/2000 Document Revised: 10/25/2011 Document Reviewed: 01/10/2009 [facility] Patient Information 2015 [employer] [place], [place]. This information is not intended to replace advice given to you by your health care provider. Make sure you discuss any questions you have with your health care provider.

## 2015-02-24 NOTE — ED Notes (Signed)
Pt presents with red ,raised rash to [place]. Pt reports itching.

## 2015-02-24 NOTE — ED Provider Notes (Signed)
CSN: [id number]     Arrival date & time 02/24/15  7829 History   First MD Initiated Contact with Patient 02/24/15 0750     Chief Complaint  Patient presents with  . Rash     (Consider location/radiation/quality/duration/timing/severity/associated sxs/prior Treatment) HPI [name] is a 39 y.o. female presents to ED with complaint of a rash. States she was pulling weeds 2 days ago. Yesterday developed a rash to bilateral arms. She went to a pharmacy and they gave her benadryl, hydrocortisone cream, benadryl cream, calamine lotion. She has been using all of that with no relief. States main thing that is bothering her is the itching. She denies any fever or chills. She denies any rash in any other parts of the body including oral mucosa. She denies any shortness of breath. No one in the family with similar rash. No other complaints.  Past Medical History  Diagnosis Date  . No pertinent past medical history    Past Surgical History  Procedure Laterality Date  . Dilation and curettage of uterus    . Cholecystectomy    . Cesarean section    . Cervical cerclage  01/05/2012    Procedure: CERCLAGE CERVICAL;  Surgeon: Adam Phenix, MD;  Location: [facility];  Service: Gynecology;  Laterality: N/A;   Family History  Problem Relation Age of Onset  . Anesthesia problems Neg Hx    History  Substance Use Topics  . Smoking status: Never Smoker   . Smokeless tobacco: Never Used  . Alcohol Use: No   OB History    Gravida Para Term Preterm AB TAB SAB Ectopic Multiple Living   0 1 0 0 3     Review of Systems  Constitutional: Negative for fever and chills.  Respiratory: Negative for cough, chest tightness and shortness of breath.   Cardiovascular: Negative for chest pain, palpitations and leg swelling.  Gastrointestinal: Negative for nausea, vomiting, abdominal pain and diarrhea.  Musculoskeletal: Negative for myalgias, arthralgias, neck pain and neck stiffness.  Skin:  Positive for rash.  Neurological: Negative for dizziness, weakness and headaches.  All other systems reviewed and are negative.     Allergies  Review of patient's allergies indicates no known allergies.  Home Medications   Prior to Admission medications   Medication Sig Start Date End Date Taking? Authorizing Provider  ferrous sulfate (FERROUSUL) 325 (65 FE) MG tablet Take 1 tablet (325 mg total) by mouth daily with breakfast. 04/09/12 04/09/13  [name], MD  Prenat Vit-FePoly-Methylfol-FA 29-1.13-0.4 MG TABS Take 1 tablet by mouth 1 day or 1 dose. 12/08/11   Deirdre C Poe, CNM   BP 122/70 mmHg  Pulse 64  Temp(Src) 97.8 F (36.6 C) (Oral)  Resp 16  Ht  (1.6 m)  Wt 211 lb 1 oz (95.737 kg)  BMI 37.40 kg/m2  SpO2 100% Physical Exam  Constitutional: She is oriented to person, place, and time. She appears well-developed and well-nourished. No distress.  HENT:  No oral mucosal lesions  Eyes: Conjunctivae are normal.  Neck: Neck supple.  Cardiovascular: Normal rate, regular rhythm and normal heart sounds.   Pulmonary/Chest: Effort normal and breath sounds normal. No respiratory distress. She has no wheezes. She has no rales.  Neurological: She is alert and oriented to person, place, and time.  Skin: Skin is warm and dry.  erythematous papular and vesicular rash to bilateral forearms.   Nursing note and vitals reviewed.   ED Course  Procedures (including  critical care time) Labs Review Labs Reviewed - No data to display  Imaging Review No results found.   EKG Interpretation None      MDM   Final diagnoses:  Contact dermatitis    patient with a rash consistent with contact dermatitis most likely with from poison ivy. She is complaining of intense itching. Already taken Benadryl, hydrocortisone cream, Benadryl cream, calamine lotion. Will treat with Decadron 10 mg IM in emergency department. Short prednisone taper for home. Follow-up as needed. Continue at-home  medications. Filed Vitals:   02/24/15 0750 02/24/15 0912  BP: 122/70 130/73  Pulse: 64 61  Temp: 97.8 F (36.6 C) 98.1 F (36.7 C)  TempSrc: Oral Oral  Resp: 16 18  Height: 5\' 3"  (1.6 m)   Weight: 211 lb 1 oz (95.737 kg)   SpO2: 100% 100%     Jaynie Crumbleatyana Kalianne Fetting, PA-C 02/24/15 1011  Rolan BuccoMelanie Belfi, MD 02/24/15 1403

## 2015-03-04 ENCOUNTER — Encounter (HOSPITAL_COMMUNITY): Payer: Self-pay | Admitting: Emergency Medicine

## 2015-03-04 ENCOUNTER — Emergency Department (HOSPITAL_COMMUNITY)
Admission: EM | Admit: 2015-03-04 | Discharge: 2015-03-04 | Disposition: A | Discharge status: Home / Self Care | Payer: Self-pay | Attending: Emergency Medicine | Admitting: Emergency Medicine

## 2015-03-04 DIAGNOSIS — Z79899 Other long term (current) drug therapy: Secondary | ICD-10-CM | POA: Insufficient documentation

## 2015-03-04 DIAGNOSIS — L259 Unspecified contact dermatitis, unspecified cause: Secondary | ICD-10-CM | POA: Insufficient documentation

## 2015-03-04 MED ORDER — HYDROXYZINE HCL 10 MG PO TABS: 10.0000 mg | ORAL_TABLET | Freq: Four times a day (QID) | ORAL | Status: DC | PRN

## 2015-03-04 MED ORDER — NAPROXEN 250 MG PO TABS: 250.0000 mg | ORAL_TABLET | Freq: Two times a day (BID) | ORAL | Status: DC

## 2015-03-04 MED ORDER — PREDNISONE 20 MG PO TABS: 40.0000 mg | ORAL_TABLET | Freq: Every day | ORAL | Status: DC

## 2015-03-04 NOTE — ED Provider Notes (Signed)
CSN: [id number]     Arrival date & time 03/04/15  1426 History   This chart was scribed for Everlene Farrier, PA-C working with Elwin Mocha, MD by Elveria Rising, ED Scribe. This patient was seen in room WTR6/WTR6 and the patient's care was started at 3:23 PM.   Chief Complaint  Patient presents with  . Rash    persistant rash x 20 days   The history is provided by the patient. A language interpreter was used.   HPI Comments: [name] is a 39 y.o. female who presents to the Emergency Department complaining of painful, pruritic, erythematous, raised rash to bilateral upper extremities for 2 weeks. Patient reports associated pain to eyes, swelling to eyelids and pain to ear where the rash has spread. Patient reports only minimal improvement with use of prescription and OTC medications. Patient [facility]/11 complaining rash development to bilateral arms one day after pulling weeds; patient works as a Administrator. Per note, patient went to a pharmacy and they gave her benadryl, hydrocortisone cream, benadryl cream, calamine lotion, which provided no relief. At visit, patient diagnosed with contact dermatitis, given injection of  Decadron and discharged with five day course Prednisone. Patient reports that the 5 day course of prednisone helped somewhat but then her symptoms returned. Patient denies shortness of breath, trouble swallowing, lip or tongue swelling.    Past Medical History  Diagnosis Date  . No pertinent past medical history    Past Surgical History  Procedure Laterality Date  . Dilation and curettage of uterus    . Cholecystectomy    . Cesarean section    . Cervical cerclage  01/05/2012    Procedure: CERCLAGE CERVICAL;  Surgeon: Adam Phenix, MD;  Location: [facility];  Service: Gynecology;  Laterality: N/A;   Family History  Problem Relation Age of Onset  . Anesthesia problems Neg Hx    History  Substance Use Topics  . Smoking status: Never Smoker   .  Smokeless tobacco: Never Used  . Alcohol Use: No   OB History    Gravida Para Term Preterm AB TAB SAB Ectopic Multiple Living   0 1 0 0 3     Review of Systems  Constitutional: Negative for fever and chills.  HENT: Negative for ear pain, sore throat and trouble swallowing.   Eyes: Positive for itching. Negative for discharge and visual disturbance.  Respiratory: Negative for cough, shortness of breath and wheezing.   Cardiovascular: Negative for chest pain.  Gastrointestinal: Negative for nausea, vomiting and abdominal pain.  Skin: Positive for color change and rash. Negative for pallor and wound.    Allergies  Review of patient's allergies indicates no known allergies.  Home Medications   Prior to Admission medications   Medication Sig Start Date End Date Taking? Authorizing Provider  ferrous sulfate (FERROUSUL) 325 (65 FE) MG tablet Take 1 tablet (325 mg total) by mouth daily with breakfast. 04/09/12 04/09/13  [name], MD  hydrOXYzine (ATARAX/VISTARIL) 10 MG tablet Take 1 tablet (10 mg total) by mouth every 6 (six) hours as needed for itching. 03/04/15   Everlene Farrier, PA-C  naproxen (NAPROSYN) 250 MG tablet Take 1 tablet (250 mg total) by mouth 2 (two) times daily with a meal. 03/04/15   Everlene Farrier, PA-C  predniSONE (DELTASONE) 20 MG tablet Take 2 tablets (40 mg total) by mouth daily. Take 40 mg (two tablets) by mouth daily for 5 days, then  (one tablet)  by mouth daily for 5 days, then 10mg  (1/2 tablet) daily for 6 days 03/04/15   Everlene Farrier, PA-C  Prenat Vit-FePoly-Methylfol-FA 29-1.13-0.4 MG TABS Take 1 tablet by mouth 1 day or 1 dose. 12/08/11   Danae Orleans, CNM   Triage Vitals: BP 120/77 mmHg  Pulse 88  Temp(Src) 98.6 F (37 C) (Oral)  Resp 16  SpO2 98%  LMP 02/03/2015 (Approximate)  Breastfeeding? No Physical Exam  Constitutional: She appears well-developed and well-nourished. No distress.  Nontoxic appearing.  HENT:  Head: Normocephalic and  atraumatic.  Right Ear: External ear normal.  Left Ear: External ear normal.  Mouth/Throat: Oropharynx is clear and moist. No oropharyngeal exudate.  No posterior oropharyngeal erythema or edema.  Eyes: Conjunctivae are normal. Pupils are equal, round, and reactive to light. Right eye exhibits no discharge. Left eye exhibits no discharge.  No eyelid edema. No conjunctival injection.  Neck: Neck supple.  Cardiovascular: Normal rate and intact distal pulses.   Pulmonary/Chest: Effort normal and breath sounds normal. No respiratory distress. She has no wheezes. She has no rales.  Lungs are clear to auscultation bilaterally.  Lymphadenopathy:    She has no cervical adenopathy.  Neurological: She is alert. Coordination normal.  Skin: Skin is warm and dry. Rash noted. She is not diaphoretic. There is erythema.  Multiple erythematous lesions to bilateral arms with linear appearance consistent with poison ivy contact dermatitis. No lesions on eye or ears.  No other rashes noted to her body. No discharge or signs of infection.  Psychiatric: She has a normal mood and affect. Her behavior is normal.  Nursing note and vitals reviewed.   ED Course  Procedures (including critical care time)  COORDINATION OF CARE: 3:35 PM- Will prescribe extended steroid course. Return precautions given. Discussed treatment plan with patient at bedside and patient agreed to plan.   Labs Review Labs Reviewed - No data to display  Imaging Review No results found.   EKG Interpretation None      Filed Vitals:   03/04/15 1434  BP: 120/77  Pulse: 88  Temp: 98.6 F (37 C)  TempSrc: Oral  Resp: 16  SpO2: 98%     MDM   Meds given in ED:  Medications - No data to display  New Prescriptions   HYDROXYZINE (ATARAX/VISTARIL) 10 MG TABLET    Take 1 tablet (10 mg total) by mouth every 6 (six) hours as needed for itching.   NAPROXEN (NAPROSYN) 250 MG TABLET    Take 1 tablet (250 mg total) by mouth 2 (two) times  daily with a meal.   PREDNISONE (DELTASONE) 20 MG TABLET    Take 2 tablets (40 mg total) by mouth daily. Take 40 mg (two tablets) by mouth daily for 5 days, then 20mg  (one tablet) by mouth daily for 5 days, then 10mg  (1/2 tablet) daily for 6 days    Final diagnoses:  Contact dermatitis   This is a 39 year old female who presents to the emergency department complaining of a continued rash to her bilateral arms for the past 2 weeks. The patient reports she was weeding in the yard 2 weeks ago and then developed a rash to her bilateral arms. Patient was seen in the emergency department on 02/24/2015 and was diagnosed with contact dermatitis consistent with poison ivy. The patient took a 5 day steroid taper and reports this helped her symptoms but they returned after completing the taper. The patient is afebrile nontoxic appearing on exam. Patient has multiple erythematous  lesions to her bilateral arms and a linear distribution consistent with contact dermatitis from poison ivy. Patient seems to have ongoing symptoms despite a short course of steroid taper. I believe the patient needs a longer course of the steroid taper. The patient's lungs are clear to auscultation bilaterally. She denies any shortness of breath or trouble swallowing. There are no other lesions on her body. We'll discharge the patient with a 16 day steroid taper as well as prescriptions for naproxen and Vistaril. I advised patient to follow-up with primary care provider and to return to the emergency department if her symptoms are not improving in 72 hours. I advised the patient to follow-up with their primary care provider this week. I advised the patient to return to the emergency department with new or worsening symptoms or new concerns. The patient verbalized understanding and agreement with plan.     I personally performed the services described in this documentation, which was scribed in my presence. The recorded information has been  reviewed and is accurate.     Nicollette Wilhelmi DanEverlene Farriersie, PA-C 03/04/15 1547  Elwin MochaBlair Walden, MD 03/04/15 2350

## 2015-03-04 NOTE — ED Notes (Signed)
Itching, red, raised rash on both  arms x 20 day. Pt stated that she had minimal response to prescribed medicines and OTC meds.. Swelling to eyelids decreased since she took prescribed meds

## 2015-03-04 NOTE — Discharge Instructions (Signed)
Poison Ivy  Poison ivy is a inflammation of the skin (contact dermatitis) caused by touching the allergens on the leaves of the ivy plant following previous exposure to the plant. The rash usually appears 48 hours after exposure. The rash is usually bumps (papules) or blisters (vesicles) in a linear pattern. Depending on your own sensitivity, the rash may simply cause redness and itching, or it may also progress to blisters which may break open. These must be well cared for to prevent secondary bacterial (germ) infection, followed by scarring. Keep any open areas dry, clean, dressed, and covered with an antibacterial ointment if needed. The eyes may also get puffy. The puffiness is worst in the morning and gets better as the day progresses. This dermatitis usually heals without scarring, within 2 to 3 weeks without treatment.  HOME CARE INSTRUCTIONS   Thoroughly wash with soap and water as soon as you have been exposed to poison ivy. You have about one half hour to remove the plant resin before it will cause the rash. This washing will destroy the oil or antigen on the skin that is causing, or will cause, the rash. Be sure to wash under your fingernails as any plant resin there will continue to spread the rash. Do not rub skin vigorously when washing affected area. Poison ivy cannot spread if no oil from the plant remains on your body. A rash that has progressed to weeping sores will not spread the rash unless you have not washed thoroughly. It is also important to wash any clothes you have been wearing as these may carry active allergens. The rash will return if you wear the unwashed clothing, even several days later.  Avoidance of the plant in the future is the best measure. Poison ivy plant can be recognized by the number of leaves. Generally, poison ivy has three leaves with flowering branches on a single stem.  Diphenhydramine may be purchased over the counter and used as needed for itching. Do not drive with  this medication if it makes you drowsy.Ask your caregiver about medication for children.  SEEK MEDICAL CARE IF:  · Open sores develop.  · Redness spreads beyond area of rash.  · You notice purulent (pus-like) discharge.  · You have increased pain.  · Other signs of infection develop (such as fever).  Document Released: 07/30/2000 Document Revised: 10/25/2011 Document Reviewed: 01/10/2009  ExitCare® Patient Information ©2015 [employer]. This information is not intended to replace advice given to you by your health care provider. Make sure you discuss any questions you have with your health care provider.    Contact Dermatitis  Contact dermatitis is a reaction to certain substances that touch the skin. Contact dermatitis can be either irritant contact dermatitis or allergic contact dermatitis. Irritant contact dermatitis does not require previous exposure to the substance for a reaction to occur. Allergic contact dermatitis only occurs if you have been exposed to the substance before. Upon a repeat exposure, your body reacts to the substance.   CAUSES   Many substances can cause contact dermatitis. Irritant dermatitis is most commonly caused by repeated exposure to mildly irritating substances, such as:  · Makeup.  · Soaps.  · Detergents.  · Bleaches.  · Acids.  · Metal salts, such as nickel.  Allergic contact dermatitis is most commonly caused by exposure to:  · Poisonous plants.  · Chemicals (deodorants, shampoos).  · Jewelry.  · Latex.  · Neomycin in triple antibiotic cream.  · Preservatives in products, including   clothing.  SYMPTOMS   The area of skin that is exposed may develop:  · Dryness or flaking.  · Redness.  · Cracks.  · Itching.  · Pain or a burning sensation.  · Blisters.  With allergic contact dermatitis, there may also be swelling in areas such as the eyelids, mouth, or genitals.   DIAGNOSIS   Your caregiver can usually tell what the problem is by doing a physical exam. In cases where the cause is  uncertain and an allergic contact dermatitis is suspected, a patch skin test may be performed to help determine the cause of your dermatitis.  TREATMENT  Treatment includes protecting the skin from further contact with the irritating substance by avoiding that substance if possible. Barrier creams, powders, and gloves may be helpful. Your caregiver may also recommend:  · Steroid creams or ointments applied 2 times daily. For best results, soak the rash area in cool water for 20 minutes. Then apply the medicine. Cover the area with a plastic wrap. You can store the steroid cream in the refrigerator for a "chilly" effect on your rash. That may decrease itching. Oral steroid medicines may be needed in more severe cases.  · Antibiotics or antibacterial ointments if a skin infection is present.  · Antihistamine lotion or an antihistamine taken by mouth to ease itching.  · Lubricants to keep moisture in your skin.  · Burow's solution to reduce redness and soreness or to dry a weeping rash. Mix one packet or tablet of solution in 2 cups cool water. Dip a clean washcloth in the mixture, wring it out a bit, and put it on the affected area. Leave the cloth in place for 30 minutes. Do this as often as possible throughout the day.  · Taking several cornstarch or baking soda baths daily if the area is too large to cover with a washcloth.  Harsh chemicals, such as alkalis or acids, can cause skin damage that is like a burn. You should flush your skin for 15 to 20 minutes with cold water after such an exposure. You should also seek immediate medical care after exposure. Bandages (dressings), antibiotics, and pain medicine may be needed for severely irritated skin.   HOME CARE INSTRUCTIONS  · Avoid the substance that caused your reaction.  · Keep the area of skin that is affected away from hot water, soap, sunlight, chemicals, acidic substances, or anything else that would irritate your skin.  · Do not scratch the rash. Scratching  may cause the rash to become infected.  · You may take cool baths to help stop the itching.  · Only take over-the-counter or prescription medicines as directed by your caregiver.  · See your caregiver for follow-up care as directed to make sure your skin is healing properly.  SEEK MEDICAL CARE IF:   · Your condition is not better after 3 days of treatment.  · You seem to be getting worse.  · You see signs of infection such as swelling, tenderness, redness, soreness, or warmth in the affected area.  · You have any problems related to your medicines.  Document Released: 07/30/2000 Document Revised: 10/25/2011 Document Reviewed: 01/05/2011  ExitCare® Patient Information ©2015 [employer]. This information is not intended to replace advice given to you by your health care provider. Make sure you discuss any questions you have with your health care provider.

## 2019-10-17 ENCOUNTER — Encounter: Payer: Self-pay | Admitting: Nurse Practitioner

## 2019-10-17 ENCOUNTER — Other Ambulatory Visit: Payer: Self-pay

## 2019-10-17 ENCOUNTER — Ambulatory Visit: Payer: Self-pay | Attending: Nurse Practitioner | Admitting: Nurse Practitioner

## 2019-10-17 VITALS — Ht 60.0 in

## 2019-10-17 DIAGNOSIS — M2550 Pain in unspecified joint: Secondary | ICD-10-CM

## 2019-10-17 DIAGNOSIS — Z131 Encounter for screening for diabetes mellitus: Secondary | ICD-10-CM

## 2019-10-17 DIAGNOSIS — Z13228 Encounter for screening for other metabolic disorders: Secondary | ICD-10-CM

## 2019-10-17 DIAGNOSIS — Z13220 Encounter for screening for lipoid disorders: Secondary | ICD-10-CM

## 2019-10-17 DIAGNOSIS — Z130 Encounter for screening for diseases of the blood and blood-forming organs and certain disorders involving the immune mechanism: Secondary | ICD-10-CM

## 2019-10-17 MED ORDER — NAPROXEN 250 MG PO TABS: 250.0000 mg | ORAL_TABLET | Freq: Two times a day (BID) | ORAL | 1 refills | Status: DC

## 2019-10-17 MED ORDER — METHOCARBAMOL 500 MG PO TABS
500.0000 mg | ORAL_TABLET | Freq: Three times a day (TID) | ORAL | 0 refills | Status: AC | PRN
Start: 2019-10-17 — End: 2019-11-16

## 2019-10-17 MED FILL — METHOCARBAMOL 500 MG TABS: 500 | 30 days supply | Qty: 90 | Fill #0

## 2019-10-17 NOTE — Progress Notes (Signed)
Virtual Visit via Telephone Note Due to national recommendations of social distancing due to [place] 19, telehealth visit is felt to be most appropriate for this patient at this time.  I discussed the limitations, risks, security and privacy concerns of performing an evaluation and management service by telephone and the availability of in person appointments. I also discussed with the patient that there may be a patient responsible charge related to this service. The patient expressed understanding and agreed to proceed.    I connected with [name] on 10/17/19  at   1:50 PM EST  EDT by telephone and verified that I am speaking with the correct person using two identifiers.   Consent I discussed the limitations, risks, security and privacy concerns of performing an evaluation and management service by telephone and the availability of in person appointments. I also discussed with the patient that there may be a patient responsible charge related to this service. The patient expressed understanding and agreed to proceed.   Location of Patient: Private  Residence    Location of Provider: [place] and [employer] Office    Persons participating in Telemedicine visit: Geryl Rankins FNP-BC [place]  Spanish IntepreterJanett Billow ID# [id number]   History of Present Illness: Telemedicine visit for: Establish Care  HEALTH Maintenance Referred to Breast Clinic for mammogram   Arthralgias States she was walking on the treadmill and doing push up and has been very sore since. Endorses pain for greater than 3 months.  Taking Tylenol 500 mg (2 tablets) which provide relief of her pain. Pain is in the hip, buttocks and legs. Aggravating factors: lying or sitting.       Past Medical History:  Diagnosis Date  . No pertinent past medical history     Past Surgical History:  Procedure Laterality Date  . CERVICAL CERCLAGE  01/05/2012   Procedure: CERCLAGE CERVICAL;  Surgeon: Woodroe Mode, MD;  Location: [facility];  Service: Gynecology;  Laterality: N/A;  . CESAREAN SECTION    . CHOLECYSTECTOMY    . DILATION AND CURETTAGE OF UTERUS      Family History  Problem Relation Age of Onset  . Diabetes Mother   . Anesthesia problems Neg Hx     Social History   Socioeconomic History  . Marital status: Married    Spouse name: Not on file  . Number of children: Not on file  . Years of education: Not on file  . Highest education level: Not on file  Occupational History  . Not on file  Tobacco Use  . Smoking status: Never Smoker  . Smokeless tobacco: Never Used  Substance and Sexual Activity  . Alcohol use: No  . Drug use: No  . Sexual activity: Not Currently    Birth control/protection: None    Comment: pregnant  Other Topics Concern  . Not on file  Social History Narrative  . Not on file   Social Determinants of Health   Financial Resource Strain:   . Difficulty of Paying Living Expenses: Not on file  Food Insecurity:   . Worried About Charity fundraiser in the Last Year: Not on file  . Ran Out of Food in the Last Year: Not on file  Transportation Needs:   . Lack of Transportation (Medical): Not on file  . Lack of Transportation (Non-Medical): Not on file  Physical Activity:   . Days of Exercise per Week: Not on file  . Minutes of Exercise per Session:  Not on file  Stress:   . Feeling of Stress : Not on file  Social Connections:   . Frequency of Communication with Friends and Family: Not on file  . Frequency of Social Gatherings with Friends and Family: Not on file  . Attends Religious Services: Not on file  . Active Member of Clubs or Organizations: Not on file  . Attends Archivist Meetings: Not on file  . Marital Status: Not on file     Observations/Objective: Awake, alert and oriented x 3   Review of Systems  Constitutional: Negative for fever, malaise/fatigue and weight loss.  HENT:  Negative.  Negative for nosebleeds.   Eyes: Negative.  Negative for blurred vision, double vision and photophobia.  Respiratory: Negative.  Negative for cough and shortness of breath.   Cardiovascular: Negative.  Negative for chest pain, palpitations and leg swelling.  Gastrointestinal: Negative.  Negative for heartburn, nausea and vomiting.  Musculoskeletal: Positive for joint pain and myalgias.  Neurological: Negative.  Negative for dizziness, focal weakness, seizures and headaches.  Psychiatric/Behavioral: Negative.  Negative for suicidal ideas.    Assessment and Plan: [name] was seen today for establish care.  Diagnoses and all orders for this visit:  Arthralgia of multiple joints -     methocarbamol (ROBAXIN) 500 MG tablet; Take 1 tablet (500 mg total) by mouth every 8 (eight) hours as needed for muscle spasms. -     naproxen (NAPROSYN) 250 MG tablet; Take 1 tablet (250 mg total) by mouth 2 (two) times daily with a meal. Work on losing weight to help reduce joint pain. May alternate with heat and ice application for pain relief. May also alternate with acetaminophen as prescribed pain relief. Other alternatives include massage, acupuncture and water aerobics.  You must stay active and avoid a sedentary lifestyle.  Screening for deficiency anemia -     CBC; Future  Screening for metabolic disorder -     UEK80+KLKJ; Future  Encounter for screening for diabetes mellitus -     Hemoglobin A1c; Future  Lipid screening -     Lipid panel; Future     Follow Up Instructions Return in about 4 weeks (around 11/14/2019) for joint pain.     I discussed the assessment and treatment plan with the patient. The patient was provided an opportunity to ask questions and all were answered. The patient agreed with the plan and demonstrated an understanding of the instructions.   The patient was advised to call back or seek an in-person evaluation if the symptoms worsen or if the condition fails  to improve as anticipated.  I provided 18 minutes of non-face-to-face time during this encounter including median intraservice time, reviewing previous notes, labs, imaging, medications and explaining diagnosis and management.  Gildardo Pounds, FNP-BC

## 2019-10-19 ENCOUNTER — Other Ambulatory Visit: Payer: Self-pay

## 2019-10-19 ENCOUNTER — Ambulatory Visit: Payer: Self-pay | Attending: Nurse Practitioner

## 2019-10-19 DIAGNOSIS — Z1231 Encounter for screening mammogram for malignant neoplasm of breast: Secondary | ICD-10-CM

## 2019-10-19 DIAGNOSIS — Z130 Encounter for screening for diseases of the blood and blood-forming organs and certain disorders involving the immune mechanism: Secondary | ICD-10-CM

## 2019-10-19 DIAGNOSIS — Z13228 Encounter for screening for other metabolic disorders: Secondary | ICD-10-CM

## 2019-10-19 DIAGNOSIS — Z131 Encounter for screening for diabetes mellitus: Secondary | ICD-10-CM

## 2019-10-19 DIAGNOSIS — Z13220 Encounter for screening for lipoid disorders: Secondary | ICD-10-CM

## 2019-10-20 LAB — LIPID PANEL
Chol/HDL Ratio: 2.8 ratio (ref 0.0–4.4)
Cholesterol, Total: 151 mg/dL (ref 100–199)
HDL: 53 mg/dL (ref >39)
LDL Chol Calc (NIH): 79 mg/dL (ref 0–99)
Triglycerides: 101 mg/dL (ref 0–149)
VLDL Cholesterol Cal: 19 mg/dL (ref 5–40)

## 2019-10-20 LAB — CMP14+EGFR
ALT: 21 IU/L (ref 0–32)
AST: 22 IU/L (ref 0–40)
Albumin/Globulin Ratio: 1.7 (ref 1.2–2.2)
Albumin: 4.5 g/dL (ref 3.8–4.8)
Alkaline Phosphatase: 102 IU/L (ref 39–117)
BUN/Creatinine Ratio: 17 (ref 9–23)
BUN: 11 mg/dL (ref 6–24)
Bilirubin Total: 0.5 mg/dL (ref 0.0–1.2)
CO2: 21 mmol/L (ref 20–29)
Calcium: 9.2 mg/dL (ref 8.7–10.2)
Chloride: 100 mmol/L (ref 96–106)
Creatinine, Ser: 0.64 mg/dL (ref 0.57–1.00)
GFR calc Af Amer: 127 mL/min/{1.73_m2} (ref >59)
GFR calc non Af Amer: 110 mL/min/{1.73_m2} (ref >59)
Globulin, Total: 2.6 g/dL (ref 1.5–4.5)
Glucose: 82 mg/dL (ref 65–99)
Potassium: 4.5 mmol/L (ref 3.5–5.2)
Sodium: 137 mmol/L (ref 134–144)
Total Protein: 7.1 g/dL (ref 6.0–8.5)

## 2019-10-20 LAB — HEMOGLOBIN A1C
Est. average glucose Bld gHb Est-mCnc: 131 mg/dL
Hgb A1c MFr Bld: 6.2 % — ABNORMAL HIGH (ref 4.8–5.6)

## 2019-10-20 LAB — CBC
Hematocrit: 40 % (ref 34.0–46.6)
Hemoglobin: 13.2 g/dL (ref 11.1–15.9)
MCH: 29.1 pg (ref 26.6–33.0)
MCHC: 33 g/dL (ref 31.5–35.7)
MCV: 88 fL (ref 79–97)
Platelets: 277 10*3/uL (ref 150–450)
RBC: 4.54 x10E6/uL (ref 3.77–5.28)
RDW: 12.8 % (ref 11.7–15.4)
WBC: 9.9 10*3/uL (ref 3.4–10.8)

## 2019-10-31 ENCOUNTER — Other Ambulatory Visit: Payer: Self-pay

## 2019-10-31 ENCOUNTER — Ambulatory Visit: Payer: Self-pay | Attending: Nurse Practitioner

## 2019-11-15 ENCOUNTER — Ambulatory Visit: Payer: Self-pay

## 2019-11-20 ENCOUNTER — Other Ambulatory Visit: Payer: Self-pay

## 2019-11-20 ENCOUNTER — Ambulatory Visit
Admission: RE | Admit: 2019-11-20 | Discharge: 2019-11-20 | Disposition: A | Discharge status: Home / Self Care | Payer: No Typology Code available for payment source | Source: Ambulatory Visit | Attending: Obstetrics and Gynecology | Admitting: Obstetrics and Gynecology

## 2019-11-20 ENCOUNTER — Ambulatory Visit: Payer: Self-pay | Admitting: Student

## 2019-11-20 VITALS — BP 130/76 | Temp 98.5°F | Wt 188.0 lb

## 2019-11-20 DIAGNOSIS — Z1239 Encounter for other screening for malignant neoplasm of breast: Secondary | ICD-10-CM

## 2019-11-20 DIAGNOSIS — Z1231 Encounter for screening mammogram for malignant neoplasm of breast: Secondary | ICD-10-CM

## 2019-11-20 NOTE — Progress Notes (Signed)
[name] is a 44 y.o. female who presents to [facility] clinic today with no complaints.    Pap Smear: Pap not smear completed today. Last Pap smear was [facility] on 09/2017 and was normal.   Per patient has history of one abnormal pap exam 13 years ago.  of an abnormal Pap smear. Last Pap smear result is available in Epic.   Physical exam: Breasts Breasts symmetrical. No skin abnormalities bilateral breasts. No nipple retraction bilateral breasts. No nipple discharge bilateral breasts. No lymphadenopathy. No lumps palpated bilateral breasts.       Pelvic/Bimanual Pap is not indicated today    Smoking History: Patient has never smoked   Patient Navigation: Patient education provided. Access to services provided for patient through [facility] program. [place] interpreter provided.   Colorectal Cancer Screening: Per patient has never had colonoscopy completed No complaints today.    Breast and Cervical Cancer Risk Assessment: Patient does not have family history of breast cancer, known genetic mutations, or radiation treatment to the chest before age 25. Patient does not have history of cervical dysplasia, immunocompromised, or DES exposure in-utero.  Risk Assessment    No risk assessment data      A: BCCCP exam without pap smear  P: Referred patient to the [facility] for a screening mammogram. Appointment scheduled this morning.  Marylene Land, CNM 11/20/2019 11:00 AM

## 2019-11-21 ENCOUNTER — Other Ambulatory Visit: Payer: Self-pay | Admitting: Obstetrics and Gynecology

## 2019-11-21 DIAGNOSIS — R928 Other abnormal and inconclusive findings on diagnostic imaging of breast: Secondary | ICD-10-CM

## 2019-11-22 ENCOUNTER — Ambulatory Visit: Payer: Self-pay

## 2019-12-04 ENCOUNTER — Other Ambulatory Visit: Payer: Self-pay

## 2019-12-04 ENCOUNTER — Ambulatory Visit
Admission: RE | Admit: 2019-12-04 | Discharge: 2019-12-04 | Disposition: A | Discharge status: Home / Self Care | Payer: No Typology Code available for payment source | Source: Ambulatory Visit | Attending: Obstetrics and Gynecology | Admitting: Obstetrics and Gynecology

## 2019-12-04 ENCOUNTER — Other Ambulatory Visit: Payer: Self-pay | Admitting: Obstetrics and Gynecology

## 2019-12-04 DIAGNOSIS — N6310 Unspecified lump in the right breast, unspecified quadrant: Secondary | ICD-10-CM

## 2019-12-04 DIAGNOSIS — R928 Other abnormal and inconclusive findings on diagnostic imaging of breast: Secondary | ICD-10-CM

## 2020-06-09 ENCOUNTER — Ambulatory Visit
Admission: RE | Admit: 2020-06-09 | Discharge: 2020-06-09 | Disposition: A | Discharge status: Home / Self Care | Payer: No Typology Code available for payment source | Source: Ambulatory Visit | Attending: Obstetrics and Gynecology | Admitting: Obstetrics and Gynecology

## 2020-06-09 ENCOUNTER — Other Ambulatory Visit: Payer: Self-pay

## 2020-06-09 DIAGNOSIS — N6310 Unspecified lump in the right breast, unspecified quadrant: Secondary | ICD-10-CM

## 2020-07-29 ENCOUNTER — Other Ambulatory Visit: Payer: Self-pay | Admitting: Obstetrics and Gynecology

## 2020-07-29 DIAGNOSIS — R928 Other abnormal and inconclusive findings on diagnostic imaging of breast: Secondary | ICD-10-CM

## 2020-07-30 ENCOUNTER — Other Ambulatory Visit: Payer: Self-pay

## 2020-07-30 DIAGNOSIS — R928 Other abnormal and inconclusive findings on diagnostic imaging of breast: Secondary | ICD-10-CM

## 2020-12-16 ENCOUNTER — Other Ambulatory Visit: Payer: No Typology Code available for payment source

## 2020-12-16 ENCOUNTER — Ambulatory Visit: Payer: No Typology Code available for payment source

## 2020-12-18 ENCOUNTER — Encounter (INDEPENDENT_AMBULATORY_CARE_PROVIDER_SITE_OTHER): Payer: Self-pay

## 2020-12-18 ENCOUNTER — Other Ambulatory Visit: Payer: Self-pay

## 2020-12-18 ENCOUNTER — Ambulatory Visit
Admission: RE | Admit: 2020-12-18 | Discharge: 2020-12-18 | Disposition: A | Discharge status: Home / Self Care | Payer: No Typology Code available for payment source | Source: Ambulatory Visit | Attending: Obstetrics and Gynecology | Admitting: Obstetrics and Gynecology

## 2020-12-18 ENCOUNTER — Ambulatory Visit: Payer: Self-pay | Admitting: *Deleted

## 2020-12-18 VITALS — BP 128/88 | Wt 196.0 lb

## 2020-12-18 DIAGNOSIS — Z01419 Encounter for gynecological examination (general) (routine) without abnormal findings: Secondary | ICD-10-CM

## 2020-12-18 DIAGNOSIS — R928 Other abnormal and inconclusive findings on diagnostic imaging of breast: Secondary | ICD-10-CM

## 2020-12-18 NOTE — Patient Instructions (Signed)
Explained breast self awareness with Nealie Zanabria-Nonato. Pap smear completed today. Let her know BCCCP will cover Pap smears and HPV typing every 5 years unless has a history of abnormal Pap smears. Referred patient to the [facility] for a diagnostic mammogram and right breast ultrasound per recommendation. Appointment scheduled Thursday, Dec 18, 2020 at 1010. Patient aware of appointment and will be there. Let patient know will follow up with her within the next couple weeks with results of Pap smear by phone or letter. Marrah Zanabria-Nonato verbalized understanding.  Anaysha Andre, Kathaleen Maser, RN\ 8:48 AM

## 2020-12-18 NOTE — Progress Notes (Signed)
[name] is a 45 y.o. [phone] female who presents to [facility] clinic today with no complaints. Patients last diagnostic mammogram was completed 06/09/2020 with bilateral diagnostic mammogram and right breast ultrasound recommended for follow-up in 85-months.   Pap Smear: Pap smear completed today. Last Pap smear was in February 2019 at the [facility] clinic and was normal per patient. Per patient has history of an abnormal Pap smear 14 years ago that a repeat Pap smear was completed for follow-up. Per patient all Pap smears have been normal since and has had at least three normal Pap smears. Last Pap smear result is not available in Epic.   Physical exam: Breasts Breasts symmetrical. No skin abnormalities bilateral breasts. No nipple retraction right breast. Left nipple slightly inverted that per patient is normal for her. No nipple discharge bilateral breasts. No lymphadenopathy. No lumps palpated bilateral breasts. No complaints of pain or tenderness on exam.     MS DIGITAL SCREENING TOMO BILATERAL  Result Date: 11/20/2019 CLINICAL DATA:  Screening. EXAM: DIGITAL SCREENING BILATERAL MAMMOGRAM WITH TOMO AND CAD COMPARISON:  None. ACR Breast Density Category c: The breast tissue is heterogeneously dense, which may obscure small masses. FINDINGS: In the right breast, a possible mass warrants further evaluation. In the left breast, no findings suspicious for malignancy. Images were processed with CAD. IMPRESSION: Further evaluation is suggested for possible mass in the right breast. RECOMMENDATION: Ultrasound of the right breast. (Code:[id number]) The patient will be contacted regarding the findings, and additional imaging will be scheduled. BI-RADS CATEGORY  0: Incomplete. Need additional imaging evaluation and/or prior mammograms for comparison. Electronically Signed   By: Sherian Rein M.D.   On: 11/20/2019 13:45   Pelvic/Bimanual Ext Genitalia No lesions, no  swelling and no discharge observed on external genitalia.        Vagina Vagina pink and normal texture. No lesions and no discharge observed in vagina.        Cervix Cervix is present. Cervix pink and of normal texture. Cervix friable. IUD strings visualized. Yellowish colored discharge observed on IUD Strings.   Uterus Uterus is present and palpable. Uterus in normal position and normal size.        Adnexae Bilateral ovaries present and palpable. No tenderness on palpation.         Rectovaginal No rectal exam completed today since patient had no rectal complaints. No skin abnormalities observed on exam.     Smoking History: Patient has never smoked.   Patient Navigation: Patient education provided. Access to services provided for patient through [place] program. Spanish interpreter [name] from [facility] provided.    Breast and Cervical Cancer Risk Assessment: Patient does not have family history of breast cancer, known genetic mutations, or radiation treatment to the chest before age 59. Patient does not have history of cervical dysplasia, immunocompromised, or DES exposure in-utero.  Risk Assessment    Risk Scores      12/18/2020   Last edited by: Meryl Dare, CMA   5-year risk: 0.5 %   Lifetime risk: 6.2 %          A: BCCCP exam with pap smear No complaints.  P: Referred patient to the [facility] for a diagnostic mammogram and right breast ultrasound per recommendation. Appointment scheduled Thursday, Dec 18, 2020 at 1010.  Priscille Heidelberg, RN 12/18/2020 8:47 AM

## 2020-12-19 LAB — CYTOLOGY - PAP
Comment: NEGATIVE
Diagnosis: NEGATIVE
High risk HPV: NEGATIVE

## 2020-12-22 ENCOUNTER — Telehealth: Payer: Self-pay

## 2020-12-22 NOTE — Telephone Encounter (Signed)
Called patient to give pap smear results via Erika McReynolds, [employer]. Informed patient that pap smear was normal and HPV was negative. Based on this result her next pap smear will be due in 5 years. Patient voiced understanding.  

## 2021-04-26 ENCOUNTER — Emergency Department (HOSPITAL_COMMUNITY): Payer: No Typology Code available for payment source

## 2021-04-26 ENCOUNTER — Other Ambulatory Visit: Payer: Self-pay

## 2021-04-26 ENCOUNTER — Emergency Department (HOSPITAL_COMMUNITY)
Admission: EM | Admit: 2021-04-26 | Discharge: 2021-04-26 | Disposition: A | Discharge status: Home / Self Care | Payer: No Typology Code available for payment source | Attending: Student | Admitting: Student

## 2021-04-26 ENCOUNTER — Encounter (HOSPITAL_COMMUNITY): Payer: Self-pay

## 2021-04-26 DIAGNOSIS — R1032 Left lower quadrant pain: Secondary | ICD-10-CM | POA: Diagnosis not present

## 2021-04-26 DIAGNOSIS — M545 Low back pain, unspecified: Secondary | ICD-10-CM | POA: Insufficient documentation

## 2021-04-26 DIAGNOSIS — Y92410 Unspecified street and highway as the place of occurrence of the external cause: Secondary | ICD-10-CM | POA: Insufficient documentation

## 2021-04-26 DIAGNOSIS — R0789 Other chest pain: Secondary | ICD-10-CM | POA: Diagnosis present

## 2021-04-26 DIAGNOSIS — M79672 Pain in left foot: Secondary | ICD-10-CM | POA: Diagnosis not present

## 2021-04-26 DIAGNOSIS — N9489 Other specified conditions associated with female genital organs and menstrual cycle: Secondary | ICD-10-CM | POA: Insufficient documentation

## 2021-04-26 DIAGNOSIS — M25552 Pain in left hip: Secondary | ICD-10-CM | POA: Diagnosis not present

## 2021-04-26 LAB — I-STAT CHEM 8, ED
BUN: 12 mg/dL (ref 6–20)
Calcium, Ion: 1.21 mmol/L (ref 1.15–1.40)
Chloride: 103 mmol/L (ref 98–111)
Creatinine, Ser: 0.5 mg/dL (ref 0.44–1.00)
Glucose, Bld: 103 mg/dL — ABNORMAL HIGH (ref 70–99)
HCT: 39 % (ref 36.0–46.0)
Hemoglobin: 13.3 g/dL (ref 12.0–15.0)
Potassium: 3.7 mmol/L (ref 3.5–5.1)
Sodium: 141 mmol/L (ref 135–145)
TCO2: 27 mmol/L (ref 22–32)

## 2021-04-26 LAB — I-STAT BETA HCG BLOOD, ED (MC, WL, AP ONLY): I-stat hCG, quantitative: <5 m[IU]/mL (ref <5)

## 2021-04-26 MED ORDER — IOHEXOL 350 MG/ML SOLN
80.0000 mL | Freq: Once | INTRAVENOUS | Status: AC | PRN
  Administered 2021-04-26: 80 mL via INTRAVENOUS
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
## 2022-09-06 IMAGING — US US BREAST*R* LIMITED INC AXILLA
1 series · 5 of 5 positions shown · non-contrast
Comparison: Previous exam(s).

CLINICAL DATA: 44-year-old female for six-month follow-up of RIGHT
breast mass.

EXAM:
ULTRASOUND OF THE RIGHT BREAST

[Series 1: us breast*right* limited inc axilla · 0.07mm/px · 5 of 5 slices shown]
[im 1/5]
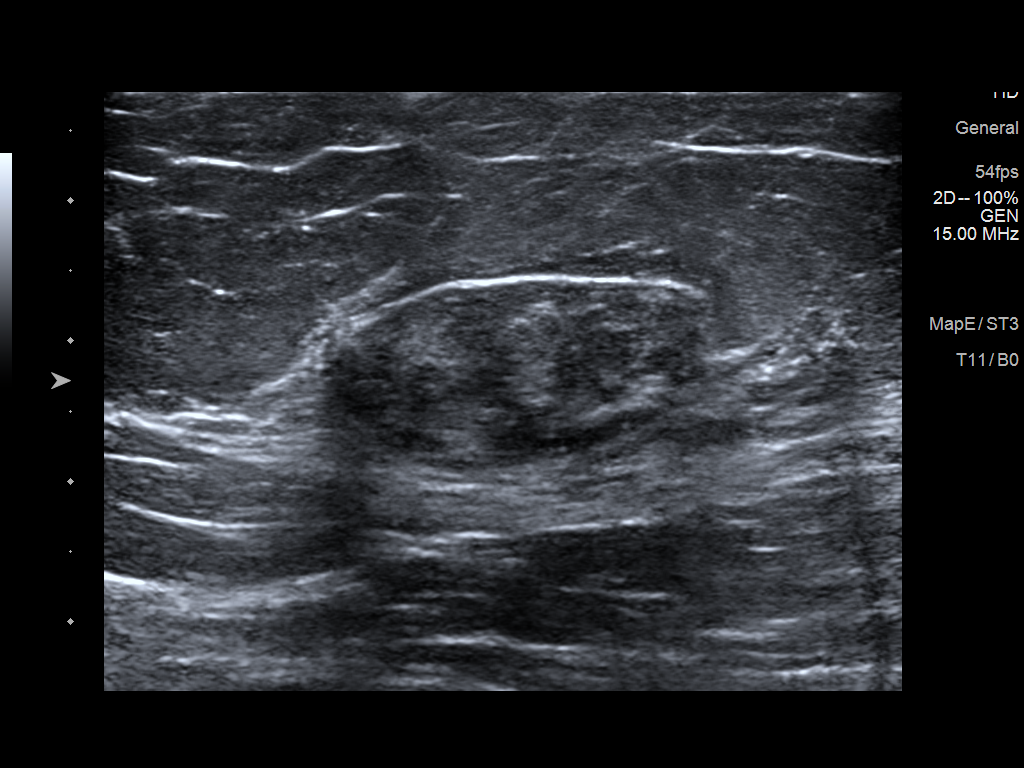
[im 2/5]
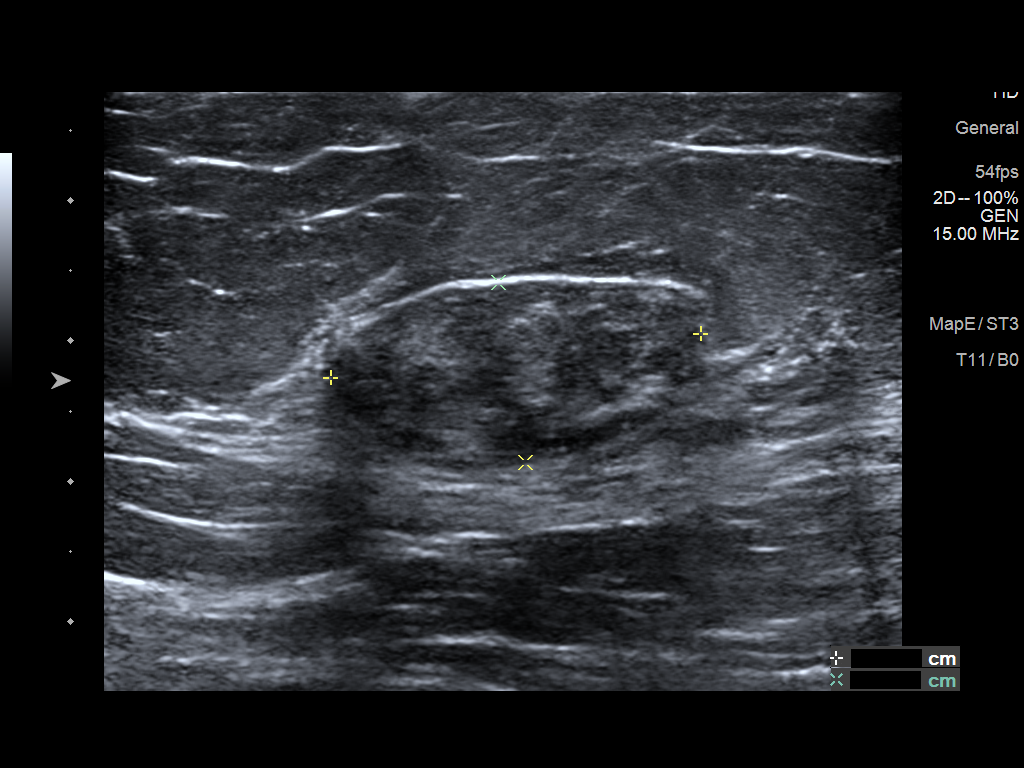
[im 3/5]
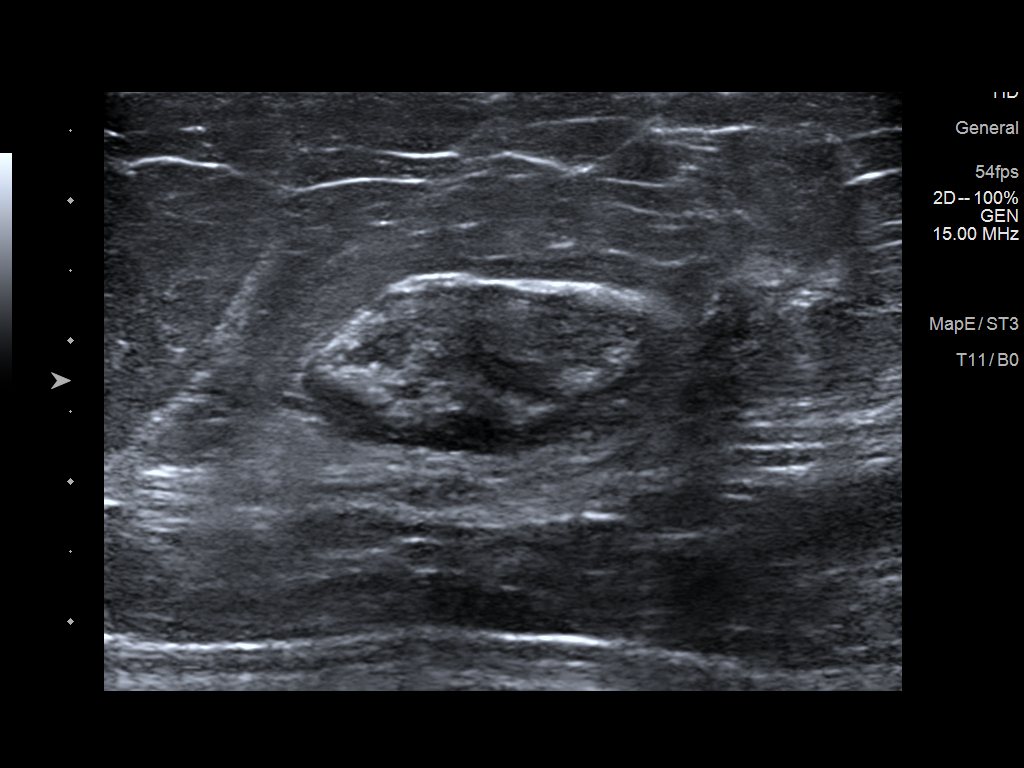
[im 4/5]
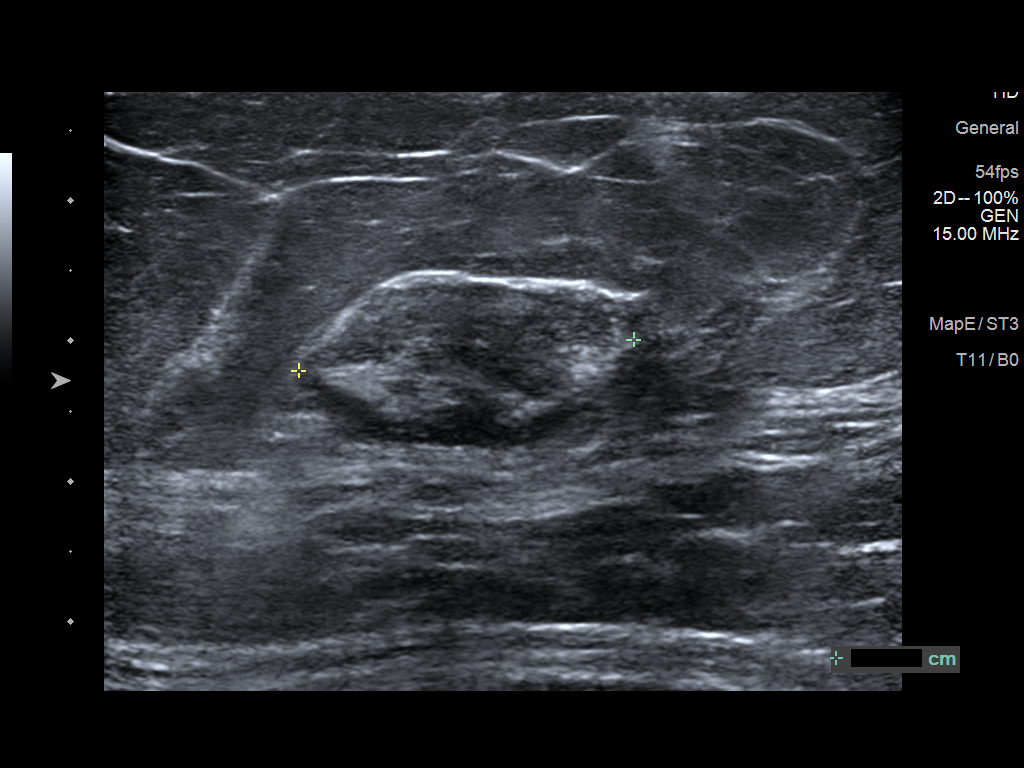
[im 5/5]
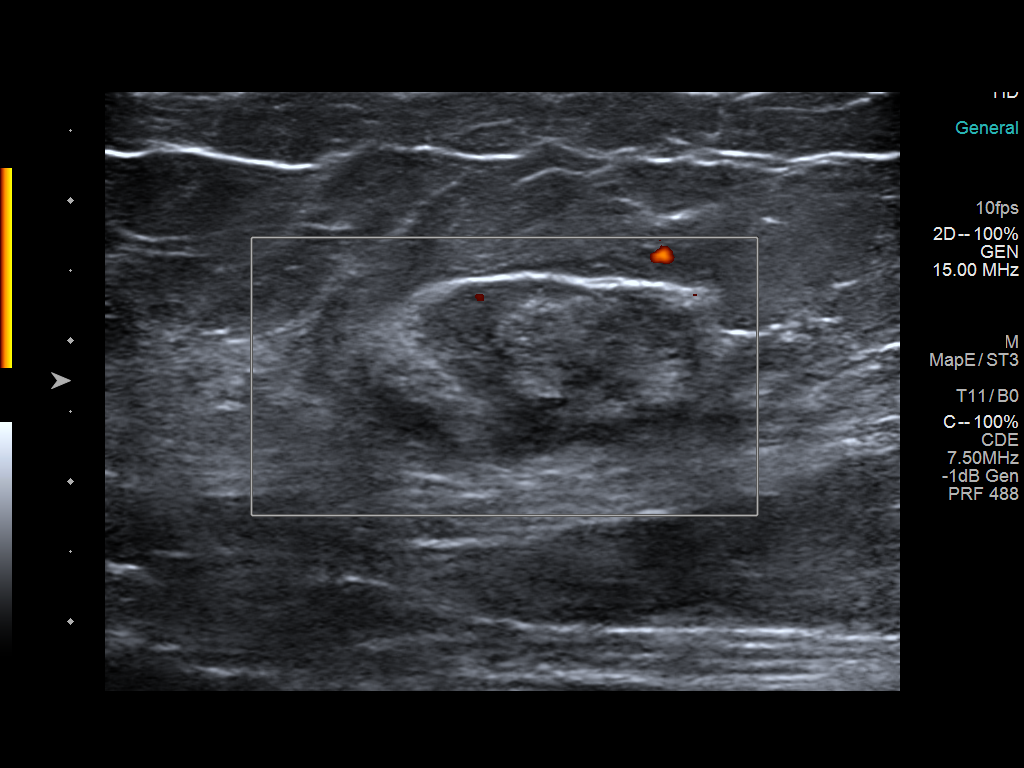

[5 of 5 positions shown; findings below may reference images not displayed]

FINDINGS: Targeted ultrasound is performed, showing a stable 2.7 x 1.3 x
cm circumscribed oval heterogeneous mass with hyperechoic components
at the 11 o'clock position of the RIGHT breast 3 cm from the nipple.
IMPRESSION: 1. Stable likely benign mass within the UPPER-OUTER RIGHT breast,
most likely a hamartoma. However given that prior mammographic
features are not definitive for a hamartoma, six-month follow-up
recommended to ensure 1 year stability.

RECOMMENDATION:
Bilateral diagnostic mammogram with RIGHT breast ultrasound in 6
months.

I have discussed the findings and recommendations with the patient.
If applicable, a reminder letter will be sent to the patient
regarding the next appointment.

BI-RADS CATEGORY  3: Probably benign.

## 2023-01-12 ENCOUNTER — Other Ambulatory Visit: Payer: Self-pay

## 2023-01-12 DIAGNOSIS — N6311 Unspecified lump in the right breast, upper outer quadrant: Secondary | ICD-10-CM

## 2023-02-10 ENCOUNTER — Ambulatory Visit
Admission: RE | Admit: 2023-02-10 | Discharge: 2023-02-10 | Disposition: A | Discharge status: Home / Self Care | Payer: No Typology Code available for payment source | Source: Ambulatory Visit | Attending: Obstetrics and Gynecology | Admitting: Obstetrics and Gynecology

## 2023-02-10 ENCOUNTER — Ambulatory Visit: Payer: Self-pay | Admitting: Hematology and Oncology

## 2023-02-10 ENCOUNTER — Ambulatory Visit
Admission: RE | Admit: 2023-02-10 | Discharge: 2023-02-10 | Disposition: A | Discharge status: Home / Self Care | Payer: Self-pay | Source: Ambulatory Visit | Attending: Obstetrics and Gynecology | Admitting: Obstetrics and Gynecology

## 2023-02-10 VITALS — BP 119/73 | Wt 197.0 lb

## 2023-02-10 DIAGNOSIS — Z1211 Encounter for screening for malignant neoplasm of colon: Secondary | ICD-10-CM

## 2023-02-10 DIAGNOSIS — N6311 Unspecified lump in the right breast, upper outer quadrant: Secondary | ICD-10-CM

## 2023-02-10 NOTE — Progress Notes (Signed)
[name] is a 47 y.o. female who presents to [facility] clinic today with no complaints. Follow up most likely benign right breast mass.    Pap Smear: Pap not smear completed today. Last Pap smear was 12/18/2020 and was normal. Per patient has no history of an abnormal Pap smear. Last Pap smear result is available in Epic.   Physical exam: Breasts Breasts symmetrical. No skin abnormalities bilateral breasts. No nipple retraction bilateral breasts. No nipple discharge bilateral breasts. No lymphadenopathy. No lumps palpated bilateral breasts. MS DIGITAL DIAG TOMO BILAT  Result Date: 12/18/2020 CLINICAL DATA:  Patient for short-term follow-up probably benign right breast mass. EXAM: DIGITAL DIAGNOSTIC BILATERAL MAMMOGRAM WITH TOMOSYNTHESIS AND CAD; ULTRASOUND RIGHT BREAST LIMITED TECHNIQUE: Bilateral digital diagnostic mammography and breast tomosynthesis was performed. The images were evaluated with computer-aided detection.; Targeted ultrasound examination of the right breast was performed COMPARISON:  Previous exam(s). ACR Breast Density Category c: The breast tissue is heterogeneously dense, which may obscure small masses. FINDINGS: Grossly unchanged partially obscured mass within the right breast. No new masses, calcifications or nonsurgical distortion identified within either breast. Targeted ultrasound is performed, showing a grossly unchanged 2.7 x 2.8 x 1.1 cm oval circumscribed hypoechoic mass right breast 11 o'clock position 3 cm from nipple. IMPRESSION: Stable probably benign right breast mass. RECOMMENDATION: Bilateral diagnostic mammography with right breast ultrasound in 12 months. I have discussed the findings and recommendations with the patient. If applicable, a reminder letter will be sent to the patient regarding the next appointment. BI-RADS CATEGORY  3: Probably benign. Electronically Signed   By: Annia Belt M.D.   On: 12/18/2020 11:28   MS DIGITAL SCREENING TOMO  BILATERAL  Result Date: 11/20/2019 CLINICAL DATA:  Screening. EXAM: DIGITAL SCREENING BILATERAL MAMMOGRAM WITH TOMO AND CAD COMPARISON:  None. ACR Breast Density Category c: The breast tissue is heterogeneously dense, which may obscure small masses. FINDINGS: In the right breast, a possible mass warrants further evaluation. In the left breast, no findings suspicious for malignancy. Images were processed with CAD. IMPRESSION: Further evaluation is suggested for possible mass in the right breast. RECOMMENDATION: Ultrasound of the right breast. (Code:US-R-70M) The patient will be contacted regarding the findings, and additional imaging will be scheduled. BI-RADS CATEGORY  0: Incomplete. Need additional imaging evaluation and/or prior mammograms for comparison. Electronically Signed   By: Sherian Rein M.D.   On: 11/20/2019 13:45          Pelvic/Bimanual Pap is not indicated today    Smoking History: Patient has never smoked and was not referred to quit line.    Patient Navigation: Patient education provided. Access to services provided for patient through BCCCP program. Natale Lay interpreter provided. No transportation provided   Colorectal Cancer Screening: Per patient has never had colonoscopy completed No complaints today. FIT test given.   Breast and Cervical Cancer Risk Assessment: Patient does not have family history of breast cancer, known genetic mutations, or radiation treatment to the chest before age 59. Patient does not have history of cervical dysplasia, immunocompromised, or DES exposure in-utero.  Risk Scores as of 02/10/2023     [name]           5-year 0.76 %   Lifetime 8.52 %   This patient is [place] but has no documented birth country, so the [place] model used data from [place] patients to calculate their risk score. Document a birth country in the Demographics activity for a more accurate score.  Last calculated by Caprice Red, CMA on 02/10/2023 at   1:34 PM        A: BCCCP exam without pap smear No complaints with benign exam.   P: Referred patient to the [facility] for a diagnostic mammogram. Appointment scheduled 02/10/23.  Pascal Lux, NP 02/10/2023 1:46 PM

## 2023-02-10 NOTE — Patient Instructions (Signed)
Taught Golden Pop Nonato about self breast awareness and gave educational materials to take home. Patient did not need a Pap smear today due to last Pap smear was in 12/18/20 per patient. Let her know BCCCP will cover Pap smears every 5 years unless has a history of abnormal Pap smears. Referred patient to the [facility] for diagnostic mammogram. Appointment scheduled for 02/10/23. Patient aware of appointment and will be there. Let patient know will follow up with her within the next couple weeks with results. Eveleigh Zanabria Nonato verbalized understanding.  Pascal Lux, NP 1:49 PM

## 2023-02-22 LAB — FECAL OCCULT BLOOD, IMMUNOCHEMICAL: Fecal Occult Bld: NEGATIVE

## 2023-02-23 NOTE — Progress Notes (Signed)
Normal FIT test letter mailed to patient.

## 2023-02-28 NOTE — Progress Notes (Signed)
Normal FIT test letter mailed 02/28/23

## 2024-03-08 ENCOUNTER — Other Ambulatory Visit: Payer: Self-pay | Admitting: Obstetrics & Gynecology

## 2024-03-08 DIAGNOSIS — Z1231 Encounter for screening mammogram for malignant neoplasm of breast: Secondary | ICD-10-CM

## 2024-04-05 ENCOUNTER — Ambulatory Visit
Admission: RE | Admit: 2024-04-05 | Discharge: 2024-04-05 | Disposition: A | Discharge status: Home / Self Care | Payer: Self-pay | Source: Ambulatory Visit | Attending: Obstetrics & Gynecology | Admitting: Obstetrics & Gynecology

## 2024-04-05 DIAGNOSIS — Z1231 Encounter for screening mammogram for malignant neoplasm of breast: Secondary | ICD-10-CM

## 2024-04-11 ENCOUNTER — Other Ambulatory Visit (HOSPITAL_COMMUNITY)
Admission: RE | Admit: 2024-04-11 | Discharge: 2024-04-11 | Disposition: A | Discharge status: Home / Self Care | Payer: Self-pay | Source: Ambulatory Visit | Attending: Obstetrics and Gynecology | Admitting: Obstetrics and Gynecology

## 2024-04-11 ENCOUNTER — Ambulatory Visit: Payer: Self-pay | Admitting: Obstetrics & Gynecology

## 2024-04-11 ENCOUNTER — Encounter: Payer: Self-pay | Admitting: Physician Assistant

## 2024-04-11 ENCOUNTER — Ambulatory Visit (INDEPENDENT_AMBULATORY_CARE_PROVIDER_SITE_OTHER): Payer: Self-pay | Admitting: Obstetrics and Gynecology

## 2024-04-11 ENCOUNTER — Other Ambulatory Visit: Payer: Self-pay

## 2024-04-11 ENCOUNTER — Encounter: Payer: Self-pay | Admitting: Obstetrics and Gynecology

## 2024-04-11 VITALS — BP 120/74 | HR 71 | Wt 196.3 lb

## 2024-04-11 DIAGNOSIS — Z01419 Encounter for gynecological examination (general) (routine) without abnormal findings: Secondary | ICD-10-CM

## 2024-04-11 DIAGNOSIS — Z30432 Encounter for removal of intrauterine contraceptive device: Secondary | ICD-10-CM

## 2024-04-11 DIAGNOSIS — Z30011 Encounter for initial prescription of contraceptive pills: Secondary | ICD-10-CM

## 2024-04-11 DIAGNOSIS — R109 Unspecified abdominal pain: Secondary | ICD-10-CM

## 2024-04-11 MED ORDER — LEVONORGESTREL-ETHINYL ESTRAD 0.1-20 MG-MCG PO TABS
1.0000 | ORAL_TABLET | Freq: Every day | ORAL | 3 refills | Status: AC
  Filled 2024-04-11: qty 84, 84d supply, fill #0
  Filled 2024-04-11: qty 90, 90d supply, fill #0
  Filled 2024-04-11: qty 28, 28d supply, fill #0
  Filled 2024-06-18: qty 84, 84d supply, fill #1

## 2024-04-11 MED ORDER — NORETHIN-ETH ESTRAD-FE BIPHAS 1 MG-10 MCG / 10 MCG PO TABS
1.0000 | ORAL_TABLET | Freq: Every day | ORAL | 11 refills | Status: DC
Start: 2024-04-11 — End: 2024-04-11
  Filled 2024-04-11: qty 28, 28d supply, fill #0

## 2024-04-11 NOTE — Progress Notes (Signed)
    GYNECOLOGY OFFICE PROCEDURE NOTE  [name] is a 48 y.o. [phone] here for  IUD removal. No GYN concerns.    IUD Removal  Patient identified, informed consent performed, consent signed.  Patient was in the dorsal lithotomy position, normal external genitalia was noted.  A speculum was placed in the patient's vagina, normal discharge was noted, no lesions. The cervix was visualized, no lesions, no abnormal discharge.  The strings of the IUD were grasped and pulled using ring forceps. The IUD was removed in its entirety.   Patient tolerated the procedure well.    Patient will use low dose OCP for contraception.  Routine preventative health maintenance measures emphasized.   Jerilynn Buddle, MD, FACOG Obstetrician & Gynecologist, [facility] for [facility], [facility]

## 2024-04-11 NOTE — Patient Instructions (Signed)
 If abdominal pain continues let us  know, we will try to set up the CT scan for further evaluation

## 2024-04-11 NOTE — Progress Notes (Signed)
 GYNECOLOGY ANNUAL PREVENTATIVE CARE ENCOUNTER NOTE  History:     [name] is a 48 y.o. [phone] female here for a routine annual gynecologic exam.  Current complaints: intermittent abdominal pain.   Denies abnormal vaginal bleeding, discharge,  problems with intercourse or other gynecologic concerns.   Pt noted intermittent abdominal pain for the past three years.  IUD was in place and pain free for 2 years prior to pain.  She desires the IUD to be removed.  The pain is intermittent and sharp.  The pain is sporadic and can be worse at night.  She denies constipation/diarrhea, dysuria or dyspareunia.  Tylenol  helps the pain.  The pain is always left sided and can be worse with movement.     Gynecologic History No LMP recorded. (Menstrual status: IUD). Contraception: IUD, now OCP Last Pap: 5/22. Results were: normal with negative HPV Last mammogram: 8/25. Results were: normal  Obstetric History OB History  Gravida Para Term Preterm AB Living  4 3 2 1 1 3   SAB IAB Ectopic Multiple Live Births  1 0 0 0 3    # Outcome Date GA Lbr Len/2nd Weight Sex Type Anes PTL Lv  4 Preterm 04/07/12 [redacted]w[redacted]d 07:57 / 00:24 2 lb 8.2 oz (1.14 kg) M VBAC None  LIV  3 SAB 12/01/08 [redacted]w[redacted]d       ND     Birth Comments: incompetent cervix, retained placent, D&C  2 Term 03/02/05 [redacted]w[redacted]d  9 lb 2 oz (4.139 kg) M CS-Unspec EPI  LIV     Birth Comments: 2nd stage arrest  1 Term 06/13/00   7 lb (3.175 kg) F Vag-Spont EPI  LIV     Birth Comments: Pt states that baby was born at 8 months.    Past Medical History:  Diagnosis Date   No pertinent past medical history     Past Surgical History:  Procedure Laterality Date   CERVICAL CERCLAGE  01/05/2012   Procedure: CERCLAGE CERVICAL;  Surgeon: Lynwood KANDICE Solomons, MD;  Location: [facility];  Service: Gynecology;  Laterality: N/A;   CESAREAN SECTION     CHOLECYSTECTOMY     DILATION AND CURETTAGE OF UTERUS      Current Outpatient Medications on File Prior to  Visit  Medication Sig Dispense Refill   Acetaminophen  (TYLENOL  PO) Take by mouth. (Patient not taking: Reported on 04/11/2024)     No current facility-administered medications on file prior to visit.    No Known Allergies  Social History:  reports that she has never smoked. She has never used smokeless tobacco. She reports that she does not drink alcohol and does not use drugs.  Family History  Problem Relation Age of Onset   Diabetes Mother    Anesthesia problems Neg Hx     The following portions of the patient's history were reviewed and updated as appropriate: allergies, current medications, past family history, past medical history, past social history, past surgical history and problem list.  Review of Systems Pertinent items noted in HPI and remainder of comprehensive ROS otherwise negative.  Physical Exam:  BP 120/74   Pulse 71   Wt 196 lb 4.8 oz (89 kg)   BMI 38.34 kg/m  CONSTITUTIONAL: Well-developed, well-nourished female in no acute distress.  HENT:  Normocephalic, atraumatic, External right and left ear normal. Oropharynx is clear and moist EYES: Conjunctivae and EOM are normal.  NECK: Normal range of motion, supple, no masses.  Normal thyroid.  SKIN: Skin is warm  and dry. No rash noted. Not diaphoretic. No erythema. No pallor. MUSCULOSKELETAL: Normal range of motion. No tenderness.  No cyanosis, clubbing, or edema.  2+ distal pulses. NEUROLOGIC: Alert and oriented to person, place, and time. Normal reflexes, muscle tone coordination.  PSYCHIATRIC: Normal mood and affect. Normal behavior. Normal judgment and thought content. CARDIOVASCULAR: Normal heart rate noted, regular rhythm RESPIRATORY: Clear to auscultation bilaterally. Effort and breath sounds normal, no problems with respiration noted. BREASTS: Symmetric in size. No masses, tenderness, skin changes, nipple drainage, or lymphadenopathy bilaterally. Left nipple is inverted and has been present several years.   Performed in the presence of a chaperone. ABDOMEN: Soft, no distention noted.  No tenderness, rebound or guarding.  PELVIC: Normal appearing external genitalia and urethral meatus; normal appearing vaginal mucosa and cervix.  No abnormal discharge noted.  No pain with palpation.    Pap smear obtained.  Normal uterine size, no other palpable masses, no uterine or adnexal tenderness.  Performed in the presence of a chaperone.   Assessment and Plan:    1. Women's annual routine gynecological examination (Primary) Normal annual exam - Cytology - PAP( [place])  2. Abdominal pain, unspecified abdominal location Do not believe pain has gyn etiology.  Pain could not be reproduced today.  Persistent left sided pain suspicious for possible diverticulitis.  If pain persists s/p removal can consider CT of abdomen  3. Encounter for IUD removal See separate note  4. Encounter for BCP (birth control pills) initial prescription After IUD removed, pt desires OCP, rx sent for low dose OCP, would use for only 1-2 years if possible - Norethindrone-Ethinyl Estradiol -Fe Biphas (LO LOESTRIN FE ) 1 MG-10 MCG / 10 MCG tablet; Take 1 tablet by mouth daily.  Dispense: 90 tablet; Refill: 3  Will follow up results of pap smear and manage accordingly. Routine preventative health maintenance measures emphasized. Please refer to After Visit Summary for other counseling recommendations.      Jerilynn Buddle, MD, FACOG Obstetrician & Gynecologist, [facility] for [facility], [facility]

## 2024-04-11 NOTE — Progress Notes (Signed)
 Pt c/o abdominal pain, comes and go, moving upward, 8/10, shooting ~3 years. IUD for 5 years. Declines STI testing, would like IUD removed and to start BC pills.

## 2024-04-12 ENCOUNTER — Telehealth: Payer: Self-pay

## 2024-04-12 ENCOUNTER — Other Ambulatory Visit (HOSPITAL_COMMUNITY): Payer: Self-pay

## 2024-04-12 ENCOUNTER — Other Ambulatory Visit: Payer: Self-pay

## 2024-04-12 NOTE — Telephone Encounter (Signed)
 Attempted to return TC to pt with interpretor regarding medication costs, no answer, left VM with callback.

## 2024-04-13 ENCOUNTER — Other Ambulatory Visit: Payer: Self-pay | Admitting: Obstetrics and Gynecology

## 2024-04-13 ENCOUNTER — Ambulatory Visit: Payer: Self-pay | Admitting: Obstetrics and Gynecology

## 2024-04-13 ENCOUNTER — Other Ambulatory Visit (HOSPITAL_COMMUNITY)
Admission: RE | Admit: 2024-04-13 | Discharge: 2024-04-13 | Disposition: A | Discharge status: Home / Self Care | Payer: Self-pay | Source: Ambulatory Visit | Attending: Obstetrics and Gynecology | Admitting: Obstetrics and Gynecology

## 2024-04-13 DIAGNOSIS — Z01419 Encounter for gynecological examination (general) (routine) without abnormal findings: Secondary | ICD-10-CM

## 2024-04-13 LAB — CYTOLOGY - PAP
Comment: NEGATIVE
Diagnosis: NEGATIVE
High risk HPV: NEGATIVE

## 2024-04-13 NOTE — Progress Notes (Signed)
 Order placed for ancillary swab

## 2024-04-17 LAB — CERVICOVAGINAL ANCILLARY ONLY
Bacterial Vaginitis (gardnerella): NEGATIVE
Candida Glabrata: NEGATIVE
Candida Vaginitis: NEGATIVE
Chlamydia: NEGATIVE
Comment: NEGATIVE
Comment: NEGATIVE
Comment: NEGATIVE
Comment: NEGATIVE
Comment: NEGATIVE
Comment: NORMAL
Neisseria Gonorrhea: NEGATIVE
Trichomonas: NEGATIVE

## 2024-04-18 ENCOUNTER — Ambulatory Visit: Payer: Self-pay | Admitting: Obstetrics and Gynecology

## 2024-06-18 ENCOUNTER — Other Ambulatory Visit: Payer: Self-pay

## 2024-06-19 ENCOUNTER — Other Ambulatory Visit: Payer: Self-pay

## 2024-08-28 ENCOUNTER — Ambulatory Visit (HOSPITAL_COMMUNITY)
Admission: EM | Admit: 2024-08-28 | Discharge: 2024-08-28 | Disposition: A | Discharge status: Home / Self Care | Payer: Self-pay | Attending: Emergency Medicine | Admitting: Emergency Medicine

## 2024-08-28 ENCOUNTER — Encounter (HOSPITAL_COMMUNITY): Payer: Self-pay

## 2024-08-28 DIAGNOSIS — R300 Dysuria: Secondary | ICD-10-CM | POA: Insufficient documentation

## 2024-08-28 DIAGNOSIS — N3000 Acute cystitis without hematuria: Secondary | ICD-10-CM | POA: Insufficient documentation

## 2024-08-28 LAB — POCT URINALYSIS DIP (MANUAL ENTRY)
Glucose, UA: 100 mg/dL — AB
Leukocytes, UA: NEGATIVE
Nitrite, UA: POSITIVE — AB
Protein Ur, POC: 30 mg/dL — AB
Spec Grav, UA: 1.025
Urobilinogen, UA: 1 U/dL
pH, UA: 5.5

## 2024-08-28 LAB — POCT URINE PREGNANCY: Preg Test, Ur: NEGATIVE

## 2024-08-28 MED ORDER — NITROFURANTOIN MONOHYD MACRO 100 MG PO CAPS: 100.0000 mg | ORAL_CAPSULE | Freq: Two times a day (BID) | ORAL | 0 refills | Status: DC

## 2024-08-28 MED ORDER — NITROFURANTOIN MONOHYD MACRO 100 MG PO CAPS: 100.0000 mg | ORAL_CAPSULE | Freq: Two times a day (BID) | ORAL | 0 refills | Status: AC

## 2024-08-28 NOTE — ED Triage Notes (Signed)
 Patient here today with c/o burning in urination since Saturday. Patient has taken AZO and Monistat with no relief.

## 2024-08-28 NOTE — Discharge Instructions (Addendum)
 Tome el [place] veces al da con alimentos durante 5 [place]. Asegrese de beber al menos 64 onzas de agua al da para [employer] riones. Tome AZO segn sea necesario para el dolor o la disuria. Los sntomas deberan mejorar con antibiticos. Si no hay mejora o se observan cambios, busque atencin de seguimiento.  Take the antibiotic twice daily with food x5 days  Ensure you are drinking at least 64 ounces of water daily to flush out the kidneys  Take the AZO as needed for pain / dysuria  Symptoms should improve with antibiotics, if no improvement or any changes seek follow-up care

## 2024-08-28 NOTE — ED Provider Notes (Signed)
 " [facility]    CSN: [id number] Arrival date & time: 08/28/24  0846      History   Chief Complaint Chief Complaint  Patient presents with   Dysuria    HPI [name] is a 49 y.o. female.   Video Spanish interpretor used for this encounter   Patient presents to clinic over concern of dysuria x 4 days  Saturday noticed dysuria, constant pain Has not had urgency or frequency Denies flank pain, nausea or emesis   Has taken AZO, last dose  [employer] as well w/o relief  The history is provided by the patient and medical records. The history is limited by a language barrier. A language interpreter was used.  Dysuria   Past Medical History:  Diagnosis Date   No pertinent past medical history     Patient Active Problem List   Diagnosis Date Noted   Proteinuria complicating pregnancy 01/27/2012   Abnormal O'Sullivan glucose challenge test, antepartum 12/23/2011   Cervical incompetence, antepartum condition or complication 12/23/2011   Hematuria of undiagnosed cause 12/23/2011   Supervision of high risk pregnancy in first trimester 12/08/2011   H/O: cesarean section 12/08/2011   Prior poor obstetrical history, antepartum 12/08/2011    Past Surgical History:  Procedure Laterality Date   CERVICAL CERCLAGE  01/05/2012   Procedure: CERCLAGE CERVICAL;  Surgeon: Lynwood KANDICE Solomons, MD;  Location: [facility];  Service: Gynecology;  Laterality: N/A;   CESAREAN SECTION     CHOLECYSTECTOMY     DILATION AND CURETTAGE OF UTERUS      OB History     Gravida  4   Para  3   Term  2   Preterm  1   AB  1   Living  3      SAB  1   IAB  0   Ectopic  0   Multiple  0   Live Births  3            Home Medications    Prior to Admission medications  Medication Sig Start Date End Date Taking? Authorizing Provider  nitrofurantoin , macrocrystal-monohydrate, (MACROBID ) 100 MG capsule Take 1 capsule (100 mg total) by mouth 2 (two) times daily.  08/28/24   Ball, Siona Coulston  G, FNP  levonorgestrel -ethinyl estradiol  (ALESSE) 0.1-20 MG-MCG tablet Take 1 tablet by mouth daily. 04/11/24   Zina Jerilynn LABOR, MD    Family History Family History  Problem Relation Age of Onset   Diabetes Mother    Anesthesia problems Neg Hx     Social History Social History[1]   Allergies   Patient has no known allergies.   Review of Systems Review of Systems  Per HPI  Physical Exam Triage Vital Signs ED Triage Vitals [08/28/24 1009]  Encounter Vitals Group     BP      Girls Systolic BP Percentile      Girls Diastolic BP Percentile      Boys Systolic BP Percentile      Boys Diastolic BP Percentile      Pulse      Resp      Temp      Temp src      SpO2      Weight      Height      Head Circumference      Peak Flow      Pain Score 9     Pain Loc      Pain Education  Exclude from Growth Chart    No data found.  Updated Vital Signs BP 134/85 (BP Location: Left Arm)   Pulse 72   Temp 98.5 F (36.9 C) (Oral)   Resp 16   LMP 08/20/2024 (Approximate)   SpO2 95%   Visual Acuity Right Eye Distance:   Left Eye Distance:   Bilateral Distance:    Right Eye Near:   Left Eye Near:    Bilateral Near:     Physical Exam Vitals and nursing note reviewed.  Constitutional:      Appearance: Normal appearance.  HENT:     Head: Normocephalic and atraumatic.     Right Ear: External ear normal.     Left Ear: External ear normal.     Nose: Nose normal.     Mouth/Throat:     Mouth: Mucous membranes are moist.  Eyes:     Conjunctiva/sclera: Conjunctivae normal.  Cardiovascular:     Rate and Rhythm: Normal rate.  Pulmonary:     Effort: Pulmonary effort is normal. No respiratory distress.  Abdominal:     Tenderness: There is no right CVA tenderness or left CVA tenderness.  Skin:    General: Skin is warm and dry.  Neurological:     General: No focal deficit present.     Mental Status: She is alert.  Psychiatric:        Mood  and Affect: Mood normal.      UC Treatments / Results  Labs (all labs ordered are listed, but only abnormal results are displayed) Labs Reviewed  POCT URINALYSIS DIP (MANUAL ENTRY) - Abnormal; Notable for the following components:      Result Value   Color, UA red (*)    Clarity, UA cloudy (*)    Glucose, UA =100 (*)    Bilirubin, UA small (*)    Ketones, POC UA trace (5) (*)    Blood, UA moderate (*)    Protein Ur, POC =30 (*)    Nitrite, UA Positive (*)    All other components within normal limits  URINE CULTURE  POCT URINE PREGNANCY    EKG   Radiology No results found.  Procedures Procedures (including critical care time)  Medications Ordered in UC Medications - No data to display  Initial Impression / Assessment and Plan / UC Course  I have reviewed the triage vital signs and the nursing notes.  Pertinent labs & imaging results that were available during my care of the patient were reviewed by me and considered in my medical decision making (see chart for details).  Vitals and triage reviewed, patient is hemodynamically stable.  Negative CVA tenderness, afebrile without tachycardia, low clinical concern for pyelonephritis.  UA with nitrites, red blood cells and discoloration, could be from Azo.  Will send for culture.  Due to symptoms will cover for acute cystitis with Macrobid .  Urine pregnancy negative.  Plan of care, follow-up care, and return precautions given, no questions at this time.    Final Clinical Impressions(s) / UC Diagnoses   Final diagnoses:  Dysuria  Acute cystitis without hematuria     Discharge Instructions      Tome el antibitico dos veces al da con alimentos durante 5 [place]. Asegrese de beber al menos 64 onzas de agua al da para [employer] riones. Tome AZO segn sea necesario para el dolor o la disuria. Los sntomas deberan mejorar con antibiticos. Si no hay mejora o se observan cambios, busque atencin de  seguimiento.  Take the antibiotic twice daily with food x5 days  Ensure you are drinking at least 64 ounces of water daily to flush out the kidneys  Take the AZO as needed for pain / dysuria  Symptoms should improve with antibiotics, if no improvement or any changes seek follow-up care       ED Prescriptions     Medication Sig Dispense Auth. Provider   nitrofurantoin , macrocrystal-monohydrate, (MACROBID ) 100 MG capsule  (Status: Discontinued) Take 1 capsule (100 mg total) by mouth 2 (two) times daily. 10 capsule Ball, Kiela Shisler  G, FNP   nitrofurantoin , macrocrystal-monohydrate, (MACROBID ) 100 MG capsule Take 1 capsule (100 mg total) by mouth 2 (two) times daily. 10 capsule Ball, Zyire Eidson  G, FNP      PDMP not reviewed this encounter.     [1]  Social History Tobacco Use   Smoking status: Never   Smokeless tobacco: Never  Vaping Use   Vaping status: Never Used  Substance Use Topics   Alcohol use: No   Drug use: No     [name]  G, FNP 08/28/24 1058  "

## 2024-08-29 LAB — URINE CULTURE: Culture: NO GROWTH

## 2024-08-30 ENCOUNTER — Ambulatory Visit (HOSPITAL_COMMUNITY): Payer: Self-pay
